# Patient Record
Sex: Female | Born: 1945 | Race: White | Hispanic: No | Marital: Single | Smoking: Never smoker
Health system: Southern US, Community
[De-identification: ages and names within clinical notes are randomized; demographics above are authoritative.]

## PROBLEM LIST (undated history)

## (undated) DIAGNOSIS — I6529 Occlusion and stenosis of unspecified carotid artery: Secondary | ICD-10-CM

## (undated) DIAGNOSIS — B379 Candidiasis, unspecified: Secondary | ICD-10-CM

## (undated) DIAGNOSIS — I350 Nonrheumatic aortic (valve) stenosis: Secondary | ICD-10-CM

## (undated) DIAGNOSIS — I10 Essential (primary) hypertension: Secondary | ICD-10-CM

## (undated) DIAGNOSIS — E119 Type 2 diabetes mellitus without complications: Secondary | ICD-10-CM

## (undated) DIAGNOSIS — L97919 Non-pressure chronic ulcer of unspecified part of right lower leg with unspecified severity: Secondary | ICD-10-CM

## (undated) DIAGNOSIS — E039 Hypothyroidism, unspecified: Secondary | ICD-10-CM

## (undated) DIAGNOSIS — E559 Vitamin D deficiency, unspecified: Secondary | ICD-10-CM

## (undated) DIAGNOSIS — E0590 Thyrotoxicosis, unspecified without thyrotoxic crisis or storm: Secondary | ICD-10-CM

## (undated) DIAGNOSIS — T148XXA Other injury of unspecified body region, initial encounter: Secondary | ICD-10-CM

## (undated) DIAGNOSIS — E785 Hyperlipidemia, unspecified: Secondary | ICD-10-CM

## (undated) DIAGNOSIS — H9193 Unspecified hearing loss, bilateral: Secondary | ICD-10-CM

## (undated) DIAGNOSIS — Z972 Presence of dental prosthetic device (complete) (partial): Secondary | ICD-10-CM

## (undated) DIAGNOSIS — R2681 Unsteadiness on feet: Secondary | ICD-10-CM

## (undated) DIAGNOSIS — I776 Arteritis, unspecified: Secondary | ICD-10-CM

## (undated) DIAGNOSIS — E1142 Type 2 diabetes mellitus with diabetic polyneuropathy: Secondary | ICD-10-CM

## (undated) DIAGNOSIS — L97929 Non-pressure chronic ulcer of unspecified part of left lower leg with unspecified severity: Secondary | ICD-10-CM

## (undated) DIAGNOSIS — I517 Cardiomegaly: Secondary | ICD-10-CM

## (undated) DIAGNOSIS — M791 Myalgia, unspecified site: Secondary | ICD-10-CM

## (undated) DIAGNOSIS — E049 Nontoxic goiter, unspecified: Secondary | ICD-10-CM

## (undated) DIAGNOSIS — L03115 Cellulitis of right lower limb: Secondary | ICD-10-CM

## (undated) DIAGNOSIS — G4762 Sleep related leg cramps: Secondary | ICD-10-CM

## (undated) DIAGNOSIS — I87331 Chronic venous hypertension (idiopathic) with ulcer and inflammation of right lower extremity: Secondary | ICD-10-CM

## (undated) DIAGNOSIS — E1140 Type 2 diabetes mellitus with diabetic neuropathy, unspecified: Secondary | ICD-10-CM

## (undated) DIAGNOSIS — R269 Unspecified abnormalities of gait and mobility: Secondary | ICD-10-CM

## (undated) HISTORY — DX: Unsteadiness on feet: R26.81

## (undated) HISTORY — DX: Chronic venous hypertension (idiopathic) with ulcer and inflammation of right lower extremity: I87.331

## (undated) HISTORY — DX: Candidiasis, unspecified: B37.9

## (undated) HISTORY — DX: Type 2 diabetes mellitus with diabetic polyneuropathy: E11.42

## (undated) HISTORY — DX: Thyrotoxicosis, unspecified without thyrotoxic crisis or storm: E05.90

## (undated) HISTORY — DX: Hyperlipidemia, unspecified: E78.5

## (undated) HISTORY — DX: Other injury of unspecified body region, initial encounter: T14.8XXA

## (undated) HISTORY — DX: Vitamin D deficiency, unspecified: E55.9

## (undated) HISTORY — DX: Arteritis, unspecified: I77.6

## (undated) HISTORY — DX: Sleep related leg cramps: G47.62

## (undated) HISTORY — PX: CATARACT EXTRACTION: SUR2

## (undated) HISTORY — DX: Essential (primary) hypertension: I10

## (undated) HISTORY — DX: Nontoxic goiter, unspecified: E04.9

## (undated) HISTORY — DX: Occlusion and stenosis of unspecified carotid artery: I65.29

## (undated) HISTORY — DX: Myalgia, unspecified site: M79.10

## (undated) HISTORY — DX: Unspecified abnormalities of gait and mobility: R26.9

## (undated) HISTORY — DX: Cellulitis of right lower limb: L03.115

## (undated) HISTORY — DX: Type 2 diabetes mellitus with diabetic neuropathy, unspecified: E11.40

## (undated) HISTORY — PX: HERNIA REPAIR: SHX51

## (undated) HISTORY — DX: Nonrheumatic aortic (valve) stenosis: I35.0

## (undated) HISTORY — DX: Cardiomegaly: I51.7

## (undated) HISTORY — DX: Non-pressure chronic ulcer of unspecified part of left lower leg with unspecified severity: L97.929

## (undated) HISTORY — DX: Non-pressure chronic ulcer of unspecified part of right lower leg with unspecified severity: L97.919

---

## 2011-09-14 DIAGNOSIS — L080 Pyoderma: Secondary | ICD-10-CM | POA: Diagnosis not present

## 2011-10-15 DIAGNOSIS — L080 Pyoderma: Secondary | ICD-10-CM | POA: Diagnosis not present

## 2011-12-25 DIAGNOSIS — E039 Hypothyroidism, unspecified: Secondary | ICD-10-CM | POA: Diagnosis not present

## 2011-12-25 DIAGNOSIS — E785 Hyperlipidemia, unspecified: Secondary | ICD-10-CM | POA: Diagnosis not present

## 2011-12-25 DIAGNOSIS — E0500 Thyrotoxicosis with diffuse goiter without thyrotoxic crisis or storm: Secondary | ICD-10-CM | POA: Diagnosis not present

## 2012-03-25 DIAGNOSIS — E039 Hypothyroidism, unspecified: Secondary | ICD-10-CM | POA: Diagnosis not present

## 2012-03-25 DIAGNOSIS — I10 Essential (primary) hypertension: Secondary | ICD-10-CM | POA: Diagnosis not present

## 2012-03-25 DIAGNOSIS — E785 Hyperlipidemia, unspecified: Secondary | ICD-10-CM | POA: Diagnosis not present

## 2012-06-25 DIAGNOSIS — E785 Hyperlipidemia, unspecified: Secondary | ICD-10-CM | POA: Diagnosis not present

## 2012-06-25 DIAGNOSIS — E039 Hypothyroidism, unspecified: Secondary | ICD-10-CM | POA: Diagnosis not present

## 2012-06-25 DIAGNOSIS — I10 Essential (primary) hypertension: Secondary | ICD-10-CM | POA: Diagnosis not present

## 2012-10-08 DIAGNOSIS — E039 Hypothyroidism, unspecified: Secondary | ICD-10-CM | POA: Diagnosis not present

## 2012-10-08 DIAGNOSIS — R269 Unspecified abnormalities of gait and mobility: Secondary | ICD-10-CM | POA: Diagnosis not present

## 2012-10-08 DIAGNOSIS — E0500 Thyrotoxicosis with diffuse goiter without thyrotoxic crisis or storm: Secondary | ICD-10-CM | POA: Diagnosis not present

## 2013-02-09 DIAGNOSIS — E0500 Thyrotoxicosis with diffuse goiter without thyrotoxic crisis or storm: Secondary | ICD-10-CM | POA: Diagnosis not present

## 2013-02-09 DIAGNOSIS — E1169 Type 2 diabetes mellitus with other specified complication: Secondary | ICD-10-CM | POA: Diagnosis not present

## 2013-02-09 DIAGNOSIS — R269 Unspecified abnormalities of gait and mobility: Secondary | ICD-10-CM | POA: Diagnosis not present

## 2013-02-09 DIAGNOSIS — I10 Essential (primary) hypertension: Secondary | ICD-10-CM | POA: Diagnosis not present

## 2013-02-09 DIAGNOSIS — E785 Hyperlipidemia, unspecified: Secondary | ICD-10-CM | POA: Diagnosis not present

## 2013-02-09 DIAGNOSIS — Z6827 Body mass index (BMI) 27.0-27.9, adult: Secondary | ICD-10-CM | POA: Diagnosis not present

## 2013-06-30 DIAGNOSIS — E785 Hyperlipidemia, unspecified: Secondary | ICD-10-CM | POA: Diagnosis not present

## 2013-06-30 DIAGNOSIS — I10 Essential (primary) hypertension: Secondary | ICD-10-CM | POA: Diagnosis not present

## 2013-06-30 DIAGNOSIS — E039 Hypothyroidism, unspecified: Secondary | ICD-10-CM | POA: Diagnosis not present

## 2013-06-30 DIAGNOSIS — Z1331 Encounter for screening for depression: Secondary | ICD-10-CM | POA: Diagnosis not present

## 2013-06-30 DIAGNOSIS — Z6826 Body mass index (BMI) 26.0-26.9, adult: Secondary | ICD-10-CM | POA: Diagnosis not present

## 2013-11-03 DIAGNOSIS — Z6826 Body mass index (BMI) 26.0-26.9, adult: Secondary | ICD-10-CM | POA: Diagnosis not present

## 2013-11-03 DIAGNOSIS — IMO0002 Reserved for concepts with insufficient information to code with codable children: Secondary | ICD-10-CM | POA: Diagnosis not present

## 2013-11-03 DIAGNOSIS — I10 Essential (primary) hypertension: Secondary | ICD-10-CM | POA: Diagnosis not present

## 2013-11-03 DIAGNOSIS — E1165 Type 2 diabetes mellitus with hyperglycemia: Secondary | ICD-10-CM | POA: Diagnosis not present

## 2013-11-03 DIAGNOSIS — E039 Hypothyroidism, unspecified: Secondary | ICD-10-CM | POA: Diagnosis not present

## 2013-11-03 DIAGNOSIS — E785 Hyperlipidemia, unspecified: Secondary | ICD-10-CM | POA: Diagnosis not present

## 2013-11-20 DIAGNOSIS — H2510 Age-related nuclear cataract, unspecified eye: Secondary | ICD-10-CM | POA: Diagnosis not present

## 2013-11-20 DIAGNOSIS — E119 Type 2 diabetes mellitus without complications: Secondary | ICD-10-CM | POA: Diagnosis not present

## 2013-12-28 DIAGNOSIS — H2510 Age-related nuclear cataract, unspecified eye: Secondary | ICD-10-CM | POA: Diagnosis not present

## 2013-12-28 DIAGNOSIS — H35349 Macular cyst, hole, or pseudohole, unspecified eye: Secondary | ICD-10-CM | POA: Diagnosis not present

## 2013-12-28 DIAGNOSIS — E119 Type 2 diabetes mellitus without complications: Secondary | ICD-10-CM | POA: Diagnosis not present

## 2014-01-06 DIAGNOSIS — H2510 Age-related nuclear cataract, unspecified eye: Secondary | ICD-10-CM | POA: Diagnosis not present

## 2014-01-13 DIAGNOSIS — H269 Unspecified cataract: Secondary | ICD-10-CM | POA: Diagnosis not present

## 2014-01-13 DIAGNOSIS — H2589 Other age-related cataract: Secondary | ICD-10-CM | POA: Diagnosis not present

## 2014-01-13 DIAGNOSIS — H2510 Age-related nuclear cataract, unspecified eye: Secondary | ICD-10-CM | POA: Diagnosis not present

## 2014-02-08 DIAGNOSIS — Z6828 Body mass index (BMI) 28.0-28.9, adult: Secondary | ICD-10-CM | POA: Diagnosis not present

## 2014-02-08 DIAGNOSIS — IMO0001 Reserved for inherently not codable concepts without codable children: Secondary | ICD-10-CM | POA: Diagnosis not present

## 2014-02-08 DIAGNOSIS — I10 Essential (primary) hypertension: Secondary | ICD-10-CM | POA: Diagnosis not present

## 2014-02-08 DIAGNOSIS — E039 Hypothyroidism, unspecified: Secondary | ICD-10-CM | POA: Diagnosis not present

## 2014-02-08 DIAGNOSIS — I776 Arteritis, unspecified: Secondary | ICD-10-CM | POA: Diagnosis not present

## 2014-02-17 DIAGNOSIS — H2510 Age-related nuclear cataract, unspecified eye: Secondary | ICD-10-CM | POA: Diagnosis not present

## 2014-03-03 DIAGNOSIS — H269 Unspecified cataract: Secondary | ICD-10-CM | POA: Diagnosis not present

## 2014-03-03 DIAGNOSIS — H2510 Age-related nuclear cataract, unspecified eye: Secondary | ICD-10-CM | POA: Diagnosis not present

## 2014-03-03 DIAGNOSIS — H2589 Other age-related cataract: Secondary | ICD-10-CM | POA: Diagnosis not present

## 2014-03-11 DIAGNOSIS — L255 Unspecified contact dermatitis due to plants, except food: Secondary | ICD-10-CM | POA: Diagnosis not present

## 2014-03-11 DIAGNOSIS — Z6828 Body mass index (BMI) 28.0-28.9, adult: Secondary | ICD-10-CM | POA: Diagnosis not present

## 2014-03-11 DIAGNOSIS — IMO0001 Reserved for inherently not codable concepts without codable children: Secondary | ICD-10-CM | POA: Diagnosis not present

## 2014-03-11 DIAGNOSIS — I10 Essential (primary) hypertension: Secondary | ICD-10-CM | POA: Diagnosis not present

## 2014-05-11 DIAGNOSIS — R269 Unspecified abnormalities of gait and mobility: Secondary | ICD-10-CM | POA: Diagnosis not present

## 2014-05-11 DIAGNOSIS — I10 Essential (primary) hypertension: Secondary | ICD-10-CM | POA: Diagnosis not present

## 2014-05-11 DIAGNOSIS — IMO0001 Reserved for inherently not codable concepts without codable children: Secondary | ICD-10-CM | POA: Diagnosis not present

## 2014-05-11 DIAGNOSIS — E0500 Thyrotoxicosis with diffuse goiter without thyrotoxic crisis or storm: Secondary | ICD-10-CM | POA: Diagnosis not present

## 2014-05-11 DIAGNOSIS — Z6827 Body mass index (BMI) 27.0-27.9, adult: Secondary | ICD-10-CM | POA: Diagnosis not present

## 2014-05-11 DIAGNOSIS — E039 Hypothyroidism, unspecified: Secondary | ICD-10-CM | POA: Diagnosis not present

## 2014-05-11 DIAGNOSIS — E785 Hyperlipidemia, unspecified: Secondary | ICD-10-CM | POA: Diagnosis not present

## 2014-06-22 DIAGNOSIS — E1165 Type 2 diabetes mellitus with hyperglycemia: Secondary | ICD-10-CM | POA: Diagnosis not present

## 2014-06-22 DIAGNOSIS — Z6828 Body mass index (BMI) 28.0-28.9, adult: Secondary | ICD-10-CM | POA: Diagnosis not present

## 2014-06-22 DIAGNOSIS — I10 Essential (primary) hypertension: Secondary | ICD-10-CM | POA: Diagnosis not present

## 2014-07-30 DIAGNOSIS — R269 Unspecified abnormalities of gait and mobility: Secondary | ICD-10-CM | POA: Diagnosis not present

## 2014-07-30 DIAGNOSIS — E1165 Type 2 diabetes mellitus with hyperglycemia: Secondary | ICD-10-CM | POA: Diagnosis not present

## 2014-07-30 DIAGNOSIS — I10 Essential (primary) hypertension: Secondary | ICD-10-CM | POA: Diagnosis not present

## 2014-07-30 DIAGNOSIS — Z1389 Encounter for screening for other disorder: Secondary | ICD-10-CM | POA: Diagnosis not present

## 2014-07-30 DIAGNOSIS — E039 Hypothyroidism, unspecified: Secondary | ICD-10-CM | POA: Diagnosis not present

## 2014-07-30 DIAGNOSIS — Z6829 Body mass index (BMI) 29.0-29.9, adult: Secondary | ICD-10-CM | POA: Diagnosis not present

## 2014-07-30 DIAGNOSIS — E785 Hyperlipidemia, unspecified: Secondary | ICD-10-CM | POA: Diagnosis not present

## 2014-08-19 DIAGNOSIS — E1165 Type 2 diabetes mellitus with hyperglycemia: Secondary | ICD-10-CM | POA: Diagnosis not present

## 2014-08-19 DIAGNOSIS — M5416 Radiculopathy, lumbar region: Secondary | ICD-10-CM | POA: Diagnosis not present

## 2014-08-19 DIAGNOSIS — L255 Unspecified contact dermatitis due to plants, except food: Secondary | ICD-10-CM | POA: Diagnosis not present

## 2014-08-19 DIAGNOSIS — Z6829 Body mass index (BMI) 29.0-29.9, adult: Secondary | ICD-10-CM | POA: Diagnosis not present

## 2014-08-23 DIAGNOSIS — L249 Irritant contact dermatitis, unspecified cause: Secondary | ICD-10-CM | POA: Diagnosis not present

## 2014-09-14 DIAGNOSIS — Z6827 Body mass index (BMI) 27.0-27.9, adult: Secondary | ICD-10-CM | POA: Diagnosis not present

## 2014-09-14 DIAGNOSIS — E1165 Type 2 diabetes mellitus with hyperglycemia: Secondary | ICD-10-CM | POA: Diagnosis not present

## 2014-09-14 DIAGNOSIS — I10 Essential (primary) hypertension: Secondary | ICD-10-CM | POA: Diagnosis not present

## 2014-10-21 DIAGNOSIS — L301 Dyshidrosis [pompholyx]: Secondary | ICD-10-CM | POA: Diagnosis not present

## 2014-11-30 DIAGNOSIS — E039 Hypothyroidism, unspecified: Secondary | ICD-10-CM | POA: Diagnosis not present

## 2014-11-30 DIAGNOSIS — E785 Hyperlipidemia, unspecified: Secondary | ICD-10-CM | POA: Diagnosis not present

## 2014-11-30 DIAGNOSIS — I10 Essential (primary) hypertension: Secondary | ICD-10-CM | POA: Diagnosis not present

## 2014-11-30 DIAGNOSIS — Z6828 Body mass index (BMI) 28.0-28.9, adult: Secondary | ICD-10-CM | POA: Diagnosis not present

## 2014-11-30 DIAGNOSIS — E1165 Type 2 diabetes mellitus with hyperglycemia: Secondary | ICD-10-CM | POA: Diagnosis not present

## 2015-04-04 DIAGNOSIS — M5416 Radiculopathy, lumbar region: Secondary | ICD-10-CM | POA: Diagnosis not present

## 2015-04-04 DIAGNOSIS — I10 Essential (primary) hypertension: Secondary | ICD-10-CM | POA: Diagnosis not present

## 2015-04-04 DIAGNOSIS — Z6827 Body mass index (BMI) 27.0-27.9, adult: Secondary | ICD-10-CM | POA: Diagnosis not present

## 2015-04-04 DIAGNOSIS — E1165 Type 2 diabetes mellitus with hyperglycemia: Secondary | ICD-10-CM | POA: Diagnosis not present

## 2015-04-04 DIAGNOSIS — R269 Unspecified abnormalities of gait and mobility: Secondary | ICD-10-CM | POA: Diagnosis not present

## 2015-04-04 DIAGNOSIS — E785 Hyperlipidemia, unspecified: Secondary | ICD-10-CM | POA: Diagnosis not present

## 2015-04-04 DIAGNOSIS — E0500 Thyrotoxicosis with diffuse goiter without thyrotoxic crisis or storm: Secondary | ICD-10-CM | POA: Diagnosis not present

## 2015-04-04 DIAGNOSIS — E039 Hypothyroidism, unspecified: Secondary | ICD-10-CM | POA: Diagnosis not present

## 2015-04-04 DIAGNOSIS — Z1389 Encounter for screening for other disorder: Secondary | ICD-10-CM | POA: Diagnosis not present

## 2015-05-31 DIAGNOSIS — Z23 Encounter for immunization: Secondary | ICD-10-CM | POA: Diagnosis not present

## 2015-05-31 DIAGNOSIS — E1165 Type 2 diabetes mellitus with hyperglycemia: Secondary | ICD-10-CM | POA: Diagnosis not present

## 2015-05-31 DIAGNOSIS — I87319 Chronic venous hypertension (idiopathic) with ulcer of unspecified lower extremity: Secondary | ICD-10-CM | POA: Diagnosis not present

## 2015-05-31 DIAGNOSIS — S8010XA Contusion of unspecified lower leg, initial encounter: Secondary | ICD-10-CM | POA: Diagnosis not present

## 2015-05-31 DIAGNOSIS — L089 Local infection of the skin and subcutaneous tissue, unspecified: Secondary | ICD-10-CM | POA: Diagnosis not present

## 2015-06-03 DIAGNOSIS — Z6827 Body mass index (BMI) 27.0-27.9, adult: Secondary | ICD-10-CM | POA: Diagnosis not present

## 2015-06-03 DIAGNOSIS — I87319 Chronic venous hypertension (idiopathic) with ulcer of unspecified lower extremity: Secondary | ICD-10-CM | POA: Diagnosis not present

## 2015-06-08 DIAGNOSIS — I87319 Chronic venous hypertension (idiopathic) with ulcer of unspecified lower extremity: Secondary | ICD-10-CM | POA: Diagnosis not present

## 2015-06-08 DIAGNOSIS — L089 Local infection of the skin and subcutaneous tissue, unspecified: Secondary | ICD-10-CM | POA: Diagnosis not present

## 2015-06-08 DIAGNOSIS — Z6826 Body mass index (BMI) 26.0-26.9, adult: Secondary | ICD-10-CM | POA: Diagnosis not present

## 2015-06-10 ENCOUNTER — Encounter (HOSPITAL_BASED_OUTPATIENT_CLINIC_OR_DEPARTMENT_OTHER): Payer: Self-pay

## 2015-06-15 DIAGNOSIS — Z6827 Body mass index (BMI) 27.0-27.9, adult: Secondary | ICD-10-CM | POA: Diagnosis not present

## 2015-06-15 DIAGNOSIS — E1165 Type 2 diabetes mellitus with hyperglycemia: Secondary | ICD-10-CM | POA: Diagnosis not present

## 2015-06-15 DIAGNOSIS — I87319 Chronic venous hypertension (idiopathic) with ulcer of unspecified lower extremity: Secondary | ICD-10-CM | POA: Diagnosis not present

## 2015-06-20 DIAGNOSIS — Z6826 Body mass index (BMI) 26.0-26.9, adult: Secondary | ICD-10-CM | POA: Diagnosis not present

## 2015-06-20 DIAGNOSIS — E1165 Type 2 diabetes mellitus with hyperglycemia: Secondary | ICD-10-CM | POA: Diagnosis not present

## 2015-06-20 DIAGNOSIS — I87319 Chronic venous hypertension (idiopathic) with ulcer of unspecified lower extremity: Secondary | ICD-10-CM | POA: Diagnosis not present

## 2015-06-24 DIAGNOSIS — I87319 Chronic venous hypertension (idiopathic) with ulcer of unspecified lower extremity: Secondary | ICD-10-CM | POA: Diagnosis not present

## 2015-06-24 DIAGNOSIS — Z6826 Body mass index (BMI) 26.0-26.9, adult: Secondary | ICD-10-CM | POA: Diagnosis not present

## 2015-07-06 ENCOUNTER — Encounter (HOSPITAL_BASED_OUTPATIENT_CLINIC_OR_DEPARTMENT_OTHER): Payer: Medicare Other | Attending: Surgery

## 2015-07-06 DIAGNOSIS — I776 Arteritis, unspecified: Secondary | ICD-10-CM | POA: Insufficient documentation

## 2015-07-06 DIAGNOSIS — Z79899 Other long term (current) drug therapy: Secondary | ICD-10-CM | POA: Insufficient documentation

## 2015-07-06 DIAGNOSIS — L97221 Non-pressure chronic ulcer of left calf limited to breakdown of skin: Secondary | ICD-10-CM | POA: Diagnosis not present

## 2015-07-06 DIAGNOSIS — S81812A Laceration without foreign body, left lower leg, initial encounter: Secondary | ICD-10-CM | POA: Diagnosis not present

## 2015-07-06 DIAGNOSIS — E11622 Type 2 diabetes mellitus with other skin ulcer: Secondary | ICD-10-CM | POA: Diagnosis present

## 2015-07-06 DIAGNOSIS — Z792 Long term (current) use of antibiotics: Secondary | ICD-10-CM | POA: Diagnosis not present

## 2015-07-06 DIAGNOSIS — I83222 Varicose veins of left lower extremity with both ulcer of calf and inflammation: Secondary | ICD-10-CM | POA: Diagnosis not present

## 2015-07-06 DIAGNOSIS — E1136 Type 2 diabetes mellitus with diabetic cataract: Secondary | ICD-10-CM | POA: Insufficient documentation

## 2015-07-06 DIAGNOSIS — L97821 Non-pressure chronic ulcer of other part of left lower leg limited to breakdown of skin: Secondary | ICD-10-CM | POA: Insufficient documentation

## 2015-07-06 DIAGNOSIS — Z794 Long term (current) use of insulin: Secondary | ICD-10-CM | POA: Diagnosis not present

## 2015-07-18 ENCOUNTER — Ambulatory Visit (HOSPITAL_COMMUNITY)
Admission: RE | Admit: 2015-07-18 | Discharge: 2015-07-18 | Disposition: A | Discharge status: Home / Self Care | Payer: Medicare Other | Source: Ambulatory Visit | Attending: Surgery | Admitting: Surgery

## 2015-07-18 ENCOUNTER — Other Ambulatory Visit: Payer: Self-pay | Admitting: Surgery

## 2015-07-18 ENCOUNTER — Ambulatory Visit (INDEPENDENT_AMBULATORY_CARE_PROVIDER_SITE_OTHER)
Admission: RE | Admit: 2015-07-18 | Discharge: 2015-07-18 | Disposition: A | Discharge status: Home / Self Care | Payer: Medicare Other | Source: Ambulatory Visit | Attending: Surgery | Admitting: Surgery

## 2015-07-18 DIAGNOSIS — L97929 Non-pressure chronic ulcer of unspecified part of left lower leg with unspecified severity: Secondary | ICD-10-CM

## 2015-07-20 DIAGNOSIS — L97821 Non-pressure chronic ulcer of other part of left lower leg limited to breakdown of skin: Secondary | ICD-10-CM | POA: Diagnosis not present

## 2015-07-20 DIAGNOSIS — Z79899 Other long term (current) drug therapy: Secondary | ICD-10-CM | POA: Diagnosis not present

## 2015-07-20 DIAGNOSIS — I83222 Varicose veins of left lower extremity with both ulcer of calf and inflammation: Secondary | ICD-10-CM | POA: Diagnosis not present

## 2015-07-20 DIAGNOSIS — E11622 Type 2 diabetes mellitus with other skin ulcer: Secondary | ICD-10-CM | POA: Diagnosis not present

## 2015-07-20 DIAGNOSIS — Z792 Long term (current) use of antibiotics: Secondary | ICD-10-CM | POA: Diagnosis not present

## 2015-07-20 DIAGNOSIS — S81812A Laceration without foreign body, left lower leg, initial encounter: Secondary | ICD-10-CM | POA: Diagnosis not present

## 2015-07-20 DIAGNOSIS — L97221 Non-pressure chronic ulcer of left calf limited to breakdown of skin: Secondary | ICD-10-CM | POA: Diagnosis not present

## 2015-07-20 DIAGNOSIS — Z794 Long term (current) use of insulin: Secondary | ICD-10-CM | POA: Diagnosis not present

## 2015-07-20 DIAGNOSIS — E1136 Type 2 diabetes mellitus with diabetic cataract: Secondary | ICD-10-CM | POA: Diagnosis not present

## 2015-08-19 DIAGNOSIS — Z6826 Body mass index (BMI) 26.0-26.9, adult: Secondary | ICD-10-CM | POA: Diagnosis not present

## 2015-08-19 DIAGNOSIS — E038 Other specified hypothyroidism: Secondary | ICD-10-CM | POA: Diagnosis not present

## 2015-08-19 DIAGNOSIS — I10 Essential (primary) hypertension: Secondary | ICD-10-CM | POA: Diagnosis not present

## 2015-08-19 DIAGNOSIS — I87319 Chronic venous hypertension (idiopathic) with ulcer of unspecified lower extremity: Secondary | ICD-10-CM | POA: Diagnosis not present

## 2015-08-19 DIAGNOSIS — E1165 Type 2 diabetes mellitus with hyperglycemia: Secondary | ICD-10-CM | POA: Diagnosis not present

## 2015-08-19 DIAGNOSIS — E784 Other hyperlipidemia: Secondary | ICD-10-CM | POA: Diagnosis not present

## 2015-08-19 DIAGNOSIS — R2689 Other abnormalities of gait and mobility: Secondary | ICD-10-CM | POA: Diagnosis not present

## 2015-09-01 DIAGNOSIS — I8312 Varicose veins of left lower extremity with inflammation: Secondary | ICD-10-CM | POA: Diagnosis not present

## 2015-09-01 DIAGNOSIS — I872 Venous insufficiency (chronic) (peripheral): Secondary | ICD-10-CM | POA: Diagnosis not present

## 2015-09-01 DIAGNOSIS — I8311 Varicose veins of right lower extremity with inflammation: Secondary | ICD-10-CM | POA: Diagnosis not present

## 2015-09-12 ENCOUNTER — Encounter (HOSPITAL_BASED_OUTPATIENT_CLINIC_OR_DEPARTMENT_OTHER): Payer: Medicare Other | Attending: Internal Medicine

## 2015-09-12 DIAGNOSIS — I8310 Varicose veins of unspecified lower extremity with inflammation: Secondary | ICD-10-CM | POA: Diagnosis not present

## 2015-09-12 DIAGNOSIS — E1159 Type 2 diabetes mellitus with other circulatory complications: Secondary | ICD-10-CM | POA: Diagnosis not present

## 2015-09-12 DIAGNOSIS — I87319 Chronic venous hypertension (idiopathic) with ulcer of unspecified lower extremity: Secondary | ICD-10-CM | POA: Diagnosis not present

## 2015-09-12 DIAGNOSIS — E1165 Type 2 diabetes mellitus with hyperglycemia: Secondary | ICD-10-CM | POA: Diagnosis not present

## 2015-09-12 DIAGNOSIS — Z6825 Body mass index (BMI) 25.0-25.9, adult: Secondary | ICD-10-CM | POA: Diagnosis not present

## 2015-09-15 DIAGNOSIS — I87319 Chronic venous hypertension (idiopathic) with ulcer of unspecified lower extremity: Secondary | ICD-10-CM | POA: Diagnosis not present

## 2015-09-15 DIAGNOSIS — E1165 Type 2 diabetes mellitus with hyperglycemia: Secondary | ICD-10-CM | POA: Diagnosis not present

## 2015-09-15 DIAGNOSIS — E1159 Type 2 diabetes mellitus with other circulatory complications: Secondary | ICD-10-CM | POA: Diagnosis not present

## 2015-09-15 DIAGNOSIS — Z6825 Body mass index (BMI) 25.0-25.9, adult: Secondary | ICD-10-CM | POA: Diagnosis not present

## 2015-09-15 DIAGNOSIS — I8310 Varicose veins of unspecified lower extremity with inflammation: Secondary | ICD-10-CM | POA: Diagnosis not present

## 2015-09-19 DIAGNOSIS — G4762 Sleep related leg cramps: Secondary | ICD-10-CM | POA: Diagnosis not present

## 2015-09-19 DIAGNOSIS — E1159 Type 2 diabetes mellitus with other circulatory complications: Secondary | ICD-10-CM | POA: Diagnosis not present

## 2015-09-19 DIAGNOSIS — Z6824 Body mass index (BMI) 24.0-24.9, adult: Secondary | ICD-10-CM | POA: Diagnosis not present

## 2015-09-19 DIAGNOSIS — I87319 Chronic venous hypertension (idiopathic) with ulcer of unspecified lower extremity: Secondary | ICD-10-CM | POA: Diagnosis not present

## 2015-09-19 DIAGNOSIS — I8310 Varicose veins of unspecified lower extremity with inflammation: Secondary | ICD-10-CM | POA: Diagnosis not present

## 2015-09-22 DIAGNOSIS — E1159 Type 2 diabetes mellitus with other circulatory complications: Secondary | ICD-10-CM | POA: Diagnosis not present

## 2015-09-22 DIAGNOSIS — I10 Essential (primary) hypertension: Secondary | ICD-10-CM | POA: Diagnosis not present

## 2015-09-22 DIAGNOSIS — Z6825 Body mass index (BMI) 25.0-25.9, adult: Secondary | ICD-10-CM | POA: Diagnosis not present

## 2015-09-22 DIAGNOSIS — I87319 Chronic venous hypertension (idiopathic) with ulcer of unspecified lower extremity: Secondary | ICD-10-CM | POA: Diagnosis not present

## 2015-09-26 DIAGNOSIS — I87319 Chronic venous hypertension (idiopathic) with ulcer of unspecified lower extremity: Secondary | ICD-10-CM | POA: Diagnosis not present

## 2015-09-26 DIAGNOSIS — I8310 Varicose veins of unspecified lower extremity with inflammation: Secondary | ICD-10-CM | POA: Diagnosis not present

## 2015-09-26 DIAGNOSIS — Z6825 Body mass index (BMI) 25.0-25.9, adult: Secondary | ICD-10-CM | POA: Diagnosis not present

## 2015-09-26 DIAGNOSIS — E1159 Type 2 diabetes mellitus with other circulatory complications: Secondary | ICD-10-CM | POA: Diagnosis not present

## 2015-09-29 DIAGNOSIS — E1159 Type 2 diabetes mellitus with other circulatory complications: Secondary | ICD-10-CM | POA: Diagnosis not present

## 2015-09-29 DIAGNOSIS — I87319 Chronic venous hypertension (idiopathic) with ulcer of unspecified lower extremity: Secondary | ICD-10-CM | POA: Diagnosis not present

## 2015-09-29 DIAGNOSIS — Z6825 Body mass index (BMI) 25.0-25.9, adult: Secondary | ICD-10-CM | POA: Diagnosis not present

## 2015-10-03 DIAGNOSIS — Z6825 Body mass index (BMI) 25.0-25.9, adult: Secondary | ICD-10-CM | POA: Diagnosis not present

## 2015-10-03 DIAGNOSIS — I87319 Chronic venous hypertension (idiopathic) with ulcer of unspecified lower extremity: Secondary | ICD-10-CM | POA: Diagnosis not present

## 2015-10-06 DIAGNOSIS — G4762 Sleep related leg cramps: Secondary | ICD-10-CM | POA: Diagnosis not present

## 2015-10-06 DIAGNOSIS — I739 Peripheral vascular disease, unspecified: Secondary | ICD-10-CM | POA: Diagnosis not present

## 2015-10-06 DIAGNOSIS — E1159 Type 2 diabetes mellitus with other circulatory complications: Secondary | ICD-10-CM | POA: Diagnosis not present

## 2015-10-06 DIAGNOSIS — I10 Essential (primary) hypertension: Secondary | ICD-10-CM | POA: Diagnosis not present

## 2015-10-06 DIAGNOSIS — I87319 Chronic venous hypertension (idiopathic) with ulcer of unspecified lower extremity: Secondary | ICD-10-CM | POA: Diagnosis not present

## 2015-10-06 DIAGNOSIS — Z6825 Body mass index (BMI) 25.0-25.9, adult: Secondary | ICD-10-CM | POA: Diagnosis not present

## 2015-10-10 DIAGNOSIS — Z6826 Body mass index (BMI) 26.0-26.9, adult: Secondary | ICD-10-CM | POA: Diagnosis not present

## 2015-10-10 DIAGNOSIS — E1159 Type 2 diabetes mellitus with other circulatory complications: Secondary | ICD-10-CM | POA: Diagnosis not present

## 2015-10-10 DIAGNOSIS — I87319 Chronic venous hypertension (idiopathic) with ulcer of unspecified lower extremity: Secondary | ICD-10-CM | POA: Diagnosis not present

## 2015-10-10 DIAGNOSIS — I10 Essential (primary) hypertension: Secondary | ICD-10-CM | POA: Diagnosis not present

## 2015-10-13 DIAGNOSIS — I87319 Chronic venous hypertension (idiopathic) with ulcer of unspecified lower extremity: Secondary | ICD-10-CM | POA: Diagnosis not present

## 2015-10-13 DIAGNOSIS — Z6826 Body mass index (BMI) 26.0-26.9, adult: Secondary | ICD-10-CM | POA: Diagnosis not present

## 2015-10-13 DIAGNOSIS — E1159 Type 2 diabetes mellitus with other circulatory complications: Secondary | ICD-10-CM | POA: Diagnosis not present

## 2015-10-13 DIAGNOSIS — I739 Peripheral vascular disease, unspecified: Secondary | ICD-10-CM | POA: Diagnosis not present

## 2015-10-17 DIAGNOSIS — I739 Peripheral vascular disease, unspecified: Secondary | ICD-10-CM | POA: Diagnosis not present

## 2015-10-17 DIAGNOSIS — E039 Hypothyroidism, unspecified: Secondary | ICD-10-CM | POA: Diagnosis not present

## 2015-10-17 DIAGNOSIS — E1159 Type 2 diabetes mellitus with other circulatory complications: Secondary | ICD-10-CM | POA: Diagnosis not present

## 2015-10-17 DIAGNOSIS — L089 Local infection of the skin and subcutaneous tissue, unspecified: Secondary | ICD-10-CM | POA: Diagnosis not present

## 2015-10-17 DIAGNOSIS — Z6826 Body mass index (BMI) 26.0-26.9, adult: Secondary | ICD-10-CM | POA: Diagnosis not present

## 2015-10-17 DIAGNOSIS — I87319 Chronic venous hypertension (idiopathic) with ulcer of unspecified lower extremity: Secondary | ICD-10-CM | POA: Diagnosis not present

## 2015-10-20 DIAGNOSIS — E1159 Type 2 diabetes mellitus with other circulatory complications: Secondary | ICD-10-CM | POA: Diagnosis not present

## 2015-10-20 DIAGNOSIS — Z6826 Body mass index (BMI) 26.0-26.9, adult: Secondary | ICD-10-CM | POA: Diagnosis not present

## 2015-10-20 DIAGNOSIS — I87319 Chronic venous hypertension (idiopathic) with ulcer of unspecified lower extremity: Secondary | ICD-10-CM | POA: Diagnosis not present

## 2015-10-24 DIAGNOSIS — E1159 Type 2 diabetes mellitus with other circulatory complications: Secondary | ICD-10-CM | POA: Diagnosis not present

## 2015-10-24 DIAGNOSIS — I739 Peripheral vascular disease, unspecified: Secondary | ICD-10-CM | POA: Diagnosis not present

## 2015-10-24 DIAGNOSIS — Z6825 Body mass index (BMI) 25.0-25.9, adult: Secondary | ICD-10-CM | POA: Diagnosis not present

## 2015-10-24 DIAGNOSIS — I87319 Chronic venous hypertension (idiopathic) with ulcer of unspecified lower extremity: Secondary | ICD-10-CM | POA: Diagnosis not present

## 2015-10-27 DIAGNOSIS — Z6825 Body mass index (BMI) 25.0-25.9, adult: Secondary | ICD-10-CM | POA: Diagnosis not present

## 2015-10-27 DIAGNOSIS — I87319 Chronic venous hypertension (idiopathic) with ulcer of unspecified lower extremity: Secondary | ICD-10-CM | POA: Diagnosis not present

## 2015-10-27 DIAGNOSIS — I739 Peripheral vascular disease, unspecified: Secondary | ICD-10-CM | POA: Diagnosis not present

## 2015-10-31 DIAGNOSIS — E1159 Type 2 diabetes mellitus with other circulatory complications: Secondary | ICD-10-CM | POA: Diagnosis not present

## 2015-10-31 DIAGNOSIS — I87319 Chronic venous hypertension (idiopathic) with ulcer of unspecified lower extremity: Secondary | ICD-10-CM | POA: Diagnosis not present

## 2015-10-31 DIAGNOSIS — Z6826 Body mass index (BMI) 26.0-26.9, adult: Secondary | ICD-10-CM | POA: Diagnosis not present

## 2015-10-31 DIAGNOSIS — I739 Peripheral vascular disease, unspecified: Secondary | ICD-10-CM | POA: Diagnosis not present

## 2015-11-03 DIAGNOSIS — I739 Peripheral vascular disease, unspecified: Secondary | ICD-10-CM | POA: Diagnosis not present

## 2015-11-03 DIAGNOSIS — Z6826 Body mass index (BMI) 26.0-26.9, adult: Secondary | ICD-10-CM | POA: Diagnosis not present

## 2015-11-03 DIAGNOSIS — I87319 Chronic venous hypertension (idiopathic) with ulcer of unspecified lower extremity: Secondary | ICD-10-CM | POA: Diagnosis not present

## 2015-11-08 DIAGNOSIS — Z5189 Encounter for other specified aftercare: Secondary | ICD-10-CM | POA: Diagnosis not present

## 2015-11-08 DIAGNOSIS — I87319 Chronic venous hypertension (idiopathic) with ulcer of unspecified lower extremity: Secondary | ICD-10-CM | POA: Diagnosis not present

## 2015-11-11 DIAGNOSIS — I739 Peripheral vascular disease, unspecified: Secondary | ICD-10-CM | POA: Diagnosis not present

## 2015-11-11 DIAGNOSIS — I10 Essential (primary) hypertension: Secondary | ICD-10-CM | POA: Diagnosis not present

## 2015-11-11 DIAGNOSIS — I87319 Chronic venous hypertension (idiopathic) with ulcer of unspecified lower extremity: Secondary | ICD-10-CM | POA: Diagnosis not present

## 2015-11-11 DIAGNOSIS — Z6828 Body mass index (BMI) 28.0-28.9, adult: Secondary | ICD-10-CM | POA: Diagnosis not present

## 2015-11-11 DIAGNOSIS — E1159 Type 2 diabetes mellitus with other circulatory complications: Secondary | ICD-10-CM | POA: Diagnosis not present

## 2015-11-14 DIAGNOSIS — Z5189 Encounter for other specified aftercare: Secondary | ICD-10-CM | POA: Diagnosis not present

## 2015-11-14 DIAGNOSIS — I87319 Chronic venous hypertension (idiopathic) with ulcer of unspecified lower extremity: Secondary | ICD-10-CM | POA: Diagnosis not present

## 2015-11-14 DIAGNOSIS — Z6826 Body mass index (BMI) 26.0-26.9, adult: Secondary | ICD-10-CM | POA: Diagnosis not present

## 2015-11-17 DIAGNOSIS — E1159 Type 2 diabetes mellitus with other circulatory complications: Secondary | ICD-10-CM | POA: Diagnosis not present

## 2015-11-17 DIAGNOSIS — I10 Essential (primary) hypertension: Secondary | ICD-10-CM | POA: Diagnosis not present

## 2015-11-17 DIAGNOSIS — I739 Peripheral vascular disease, unspecified: Secondary | ICD-10-CM | POA: Diagnosis not present

## 2015-11-17 DIAGNOSIS — I87319 Chronic venous hypertension (idiopathic) with ulcer of unspecified lower extremity: Secondary | ICD-10-CM | POA: Diagnosis not present

## 2015-11-17 DIAGNOSIS — Z6826 Body mass index (BMI) 26.0-26.9, adult: Secondary | ICD-10-CM | POA: Diagnosis not present

## 2015-11-21 DIAGNOSIS — I87319 Chronic venous hypertension (idiopathic) with ulcer of unspecified lower extremity: Secondary | ICD-10-CM | POA: Diagnosis not present

## 2015-11-21 DIAGNOSIS — Z5189 Encounter for other specified aftercare: Secondary | ICD-10-CM | POA: Diagnosis not present

## 2015-11-21 DIAGNOSIS — Z6824 Body mass index (BMI) 24.0-24.9, adult: Secondary | ICD-10-CM | POA: Diagnosis not present

## 2015-11-24 DIAGNOSIS — I87319 Chronic venous hypertension (idiopathic) with ulcer of unspecified lower extremity: Secondary | ICD-10-CM | POA: Diagnosis not present

## 2015-11-24 DIAGNOSIS — I739 Peripheral vascular disease, unspecified: Secondary | ICD-10-CM | POA: Diagnosis not present

## 2015-11-24 DIAGNOSIS — Z6824 Body mass index (BMI) 24.0-24.9, adult: Secondary | ICD-10-CM | POA: Diagnosis not present

## 2015-11-24 DIAGNOSIS — Z5189 Encounter for other specified aftercare: Secondary | ICD-10-CM | POA: Diagnosis not present

## 2015-11-28 DIAGNOSIS — I739 Peripheral vascular disease, unspecified: Secondary | ICD-10-CM | POA: Diagnosis not present

## 2015-11-28 DIAGNOSIS — Z5189 Encounter for other specified aftercare: Secondary | ICD-10-CM | POA: Diagnosis not present

## 2015-11-28 DIAGNOSIS — I87319 Chronic venous hypertension (idiopathic) with ulcer of unspecified lower extremity: Secondary | ICD-10-CM | POA: Diagnosis not present

## 2015-11-28 DIAGNOSIS — Z6826 Body mass index (BMI) 26.0-26.9, adult: Secondary | ICD-10-CM | POA: Diagnosis not present

## 2015-12-01 DIAGNOSIS — Z6826 Body mass index (BMI) 26.0-26.9, adult: Secondary | ICD-10-CM | POA: Diagnosis not present

## 2015-12-01 DIAGNOSIS — I739 Peripheral vascular disease, unspecified: Secondary | ICD-10-CM | POA: Diagnosis not present

## 2015-12-01 DIAGNOSIS — I87319 Chronic venous hypertension (idiopathic) with ulcer of unspecified lower extremity: Secondary | ICD-10-CM | POA: Diagnosis not present

## 2015-12-01 DIAGNOSIS — Z5189 Encounter for other specified aftercare: Secondary | ICD-10-CM | POA: Diagnosis not present

## 2015-12-08 DIAGNOSIS — I87319 Chronic venous hypertension (idiopathic) with ulcer of unspecified lower extremity: Secondary | ICD-10-CM | POA: Diagnosis not present

## 2015-12-08 DIAGNOSIS — Z5189 Encounter for other specified aftercare: Secondary | ICD-10-CM | POA: Diagnosis not present

## 2015-12-08 DIAGNOSIS — I739 Peripheral vascular disease, unspecified: Secondary | ICD-10-CM | POA: Diagnosis not present

## 2015-12-08 DIAGNOSIS — Z6826 Body mass index (BMI) 26.0-26.9, adult: Secondary | ICD-10-CM | POA: Diagnosis not present

## 2015-12-15 DIAGNOSIS — I87319 Chronic venous hypertension (idiopathic) with ulcer of unspecified lower extremity: Secondary | ICD-10-CM | POA: Diagnosis not present

## 2015-12-15 DIAGNOSIS — I739 Peripheral vascular disease, unspecified: Secondary | ICD-10-CM | POA: Diagnosis not present

## 2015-12-15 DIAGNOSIS — I10 Essential (primary) hypertension: Secondary | ICD-10-CM | POA: Diagnosis not present

## 2015-12-15 DIAGNOSIS — E1159 Type 2 diabetes mellitus with other circulatory complications: Secondary | ICD-10-CM | POA: Diagnosis not present

## 2015-12-15 DIAGNOSIS — Z6826 Body mass index (BMI) 26.0-26.9, adult: Secondary | ICD-10-CM | POA: Diagnosis not present

## 2015-12-22 DIAGNOSIS — I87319 Chronic venous hypertension (idiopathic) with ulcer of unspecified lower extremity: Secondary | ICD-10-CM | POA: Diagnosis not present

## 2015-12-22 DIAGNOSIS — I739 Peripheral vascular disease, unspecified: Secondary | ICD-10-CM | POA: Diagnosis not present

## 2015-12-22 DIAGNOSIS — Z6826 Body mass index (BMI) 26.0-26.9, adult: Secondary | ICD-10-CM | POA: Diagnosis not present

## 2015-12-29 DIAGNOSIS — I10 Essential (primary) hypertension: Secondary | ICD-10-CM | POA: Diagnosis not present

## 2015-12-29 DIAGNOSIS — Z6826 Body mass index (BMI) 26.0-26.9, adult: Secondary | ICD-10-CM | POA: Diagnosis not present

## 2015-12-29 DIAGNOSIS — I87319 Chronic venous hypertension (idiopathic) with ulcer of unspecified lower extremity: Secondary | ICD-10-CM | POA: Diagnosis not present

## 2015-12-29 DIAGNOSIS — E1159 Type 2 diabetes mellitus with other circulatory complications: Secondary | ICD-10-CM | POA: Diagnosis not present

## 2015-12-29 DIAGNOSIS — I739 Peripheral vascular disease, unspecified: Secondary | ICD-10-CM | POA: Diagnosis not present

## 2016-01-05 DIAGNOSIS — I87319 Chronic venous hypertension (idiopathic) with ulcer of unspecified lower extremity: Secondary | ICD-10-CM | POA: Diagnosis not present

## 2016-01-05 DIAGNOSIS — Z6826 Body mass index (BMI) 26.0-26.9, adult: Secondary | ICD-10-CM | POA: Diagnosis not present

## 2016-01-05 DIAGNOSIS — Z5189 Encounter for other specified aftercare: Secondary | ICD-10-CM | POA: Diagnosis not present

## 2016-01-05 DIAGNOSIS — I739 Peripheral vascular disease, unspecified: Secondary | ICD-10-CM | POA: Diagnosis not present

## 2016-01-12 DIAGNOSIS — Z5189 Encounter for other specified aftercare: Secondary | ICD-10-CM | POA: Diagnosis not present

## 2016-01-12 DIAGNOSIS — I87319 Chronic venous hypertension (idiopathic) with ulcer of unspecified lower extremity: Secondary | ICD-10-CM | POA: Diagnosis not present

## 2016-01-19 DIAGNOSIS — E1159 Type 2 diabetes mellitus with other circulatory complications: Secondary | ICD-10-CM | POA: Diagnosis not present

## 2016-01-19 DIAGNOSIS — I8310 Varicose veins of unspecified lower extremity with inflammation: Secondary | ICD-10-CM | POA: Diagnosis not present

## 2016-01-19 DIAGNOSIS — Z6826 Body mass index (BMI) 26.0-26.9, adult: Secondary | ICD-10-CM | POA: Diagnosis not present

## 2016-01-19 DIAGNOSIS — I87319 Chronic venous hypertension (idiopathic) with ulcer of unspecified lower extremity: Secondary | ICD-10-CM | POA: Diagnosis not present

## 2016-01-19 DIAGNOSIS — I739 Peripheral vascular disease, unspecified: Secondary | ICD-10-CM | POA: Diagnosis not present

## 2016-01-26 DIAGNOSIS — I8310 Varicose veins of unspecified lower extremity with inflammation: Secondary | ICD-10-CM | POA: Diagnosis not present

## 2016-01-26 DIAGNOSIS — Z6826 Body mass index (BMI) 26.0-26.9, adult: Secondary | ICD-10-CM | POA: Diagnosis not present

## 2016-01-26 DIAGNOSIS — I87319 Chronic venous hypertension (idiopathic) with ulcer of unspecified lower extremity: Secondary | ICD-10-CM | POA: Diagnosis not present

## 2016-02-03 DIAGNOSIS — W57XXXA Bitten or stung by nonvenomous insect and other nonvenomous arthropods, initial encounter: Secondary | ICD-10-CM | POA: Diagnosis not present

## 2016-02-03 DIAGNOSIS — I739 Peripheral vascular disease, unspecified: Secondary | ICD-10-CM | POA: Diagnosis not present

## 2016-02-03 DIAGNOSIS — I10 Essential (primary) hypertension: Secondary | ICD-10-CM | POA: Diagnosis not present

## 2016-02-03 DIAGNOSIS — Z6827 Body mass index (BMI) 27.0-27.9, adult: Secondary | ICD-10-CM | POA: Diagnosis not present

## 2016-02-03 DIAGNOSIS — I8310 Varicose veins of unspecified lower extremity with inflammation: Secondary | ICD-10-CM | POA: Diagnosis not present

## 2016-02-17 DIAGNOSIS — Z6827 Body mass index (BMI) 27.0-27.9, adult: Secondary | ICD-10-CM | POA: Diagnosis not present

## 2016-02-17 DIAGNOSIS — I739 Peripheral vascular disease, unspecified: Secondary | ICD-10-CM | POA: Diagnosis not present

## 2016-02-17 DIAGNOSIS — E038 Other specified hypothyroidism: Secondary | ICD-10-CM | POA: Diagnosis not present

## 2016-02-17 DIAGNOSIS — I10 Essential (primary) hypertension: Secondary | ICD-10-CM | POA: Diagnosis not present

## 2016-02-17 DIAGNOSIS — E1159 Type 2 diabetes mellitus with other circulatory complications: Secondary | ICD-10-CM | POA: Diagnosis not present

## 2016-05-21 DIAGNOSIS — Z6828 Body mass index (BMI) 28.0-28.9, adult: Secondary | ICD-10-CM | POA: Diagnosis not present

## 2016-05-21 DIAGNOSIS — I8310 Varicose veins of unspecified lower extremity with inflammation: Secondary | ICD-10-CM | POA: Diagnosis not present

## 2016-05-21 DIAGNOSIS — E1159 Type 2 diabetes mellitus with other circulatory complications: Secondary | ICD-10-CM | POA: Diagnosis not present

## 2016-05-21 DIAGNOSIS — I10 Essential (primary) hypertension: Secondary | ICD-10-CM | POA: Diagnosis not present

## 2016-05-21 DIAGNOSIS — E038 Other specified hypothyroidism: Secondary | ICD-10-CM | POA: Diagnosis not present

## 2016-05-21 DIAGNOSIS — I739 Peripheral vascular disease, unspecified: Secondary | ICD-10-CM | POA: Diagnosis not present

## 2016-05-27 ENCOUNTER — Encounter (HOSPITAL_COMMUNITY): Payer: Self-pay | Admitting: *Deleted

## 2016-05-27 ENCOUNTER — Emergency Department (HOSPITAL_COMMUNITY)
Admission: EM | Admit: 2016-05-27 | Discharge: 2016-05-27 | Disposition: A | Discharge status: Home / Self Care | Payer: Medicare Other | Attending: Emergency Medicine | Admitting: Emergency Medicine

## 2016-05-27 DIAGNOSIS — Y939 Activity, unspecified: Secondary | ICD-10-CM | POA: Diagnosis not present

## 2016-05-27 DIAGNOSIS — S61411A Laceration without foreign body of right hand, initial encounter: Secondary | ICD-10-CM | POA: Diagnosis not present

## 2016-05-27 DIAGNOSIS — S61451A Open bite of right hand, initial encounter: Secondary | ICD-10-CM | POA: Insufficient documentation

## 2016-05-27 DIAGNOSIS — E119 Type 2 diabetes mellitus without complications: Secondary | ICD-10-CM | POA: Diagnosis not present

## 2016-05-27 DIAGNOSIS — Y929 Unspecified place or not applicable: Secondary | ICD-10-CM | POA: Diagnosis not present

## 2016-05-27 DIAGNOSIS — W5501XA Bitten by cat, initial encounter: Secondary | ICD-10-CM | POA: Insufficient documentation

## 2016-05-27 DIAGNOSIS — Z7984 Long term (current) use of oral hypoglycemic drugs: Secondary | ICD-10-CM | POA: Insufficient documentation

## 2016-05-27 DIAGNOSIS — Z794 Long term (current) use of insulin: Secondary | ICD-10-CM | POA: Insufficient documentation

## 2016-05-27 DIAGNOSIS — Y999 Unspecified external cause status: Secondary | ICD-10-CM | POA: Diagnosis not present

## 2016-05-27 HISTORY — DX: Type 2 diabetes mellitus without complications: E11.9

## 2016-05-27 LAB — CBG MONITORING, ED: GLUCOSE-CAPILLARY: 161 mg/dL — AB (ref 65–99)

## 2016-05-27 MED ORDER — AMOXICILLIN-POT CLAVULANATE 875-125 MG PO TABS: 1.0000 | ORAL_TABLET | Freq: Two times a day (BID) | ORAL | 0 refills | Status: DC

## 2016-05-27 MED ORDER — LIDOCAINE HCL (PF) 1 % IJ SOLN
30.0000 mL | Freq: Once | INTRAMUSCULAR | Status: AC
  Administered 2016-05-27: 30 mL via INTRADERMAL
  Filled 2016-05-27: qty 30

## 2016-05-27 NOTE — ED Notes (Signed)
Patient came in after her pet cat bit and scratched her hands and wrists. The cat has had all it's vaccinations and became angry after the patient's finger got caught in the cat's collar. The patient has bleeing lacerations to her hands and wrists, her right hand has deep lacerations. She can move all her fingers. Fingers are swollen. Wounds were cleansed by RN. Patient is a/ox4. Hx of DM2.

## 2016-05-27 NOTE — ED Provider Notes (Signed)
[facility] DEPT Provider Note   CSN: [id number] Arrival date & time: 05/27/16  1543     History   Chief Complaint Chief Complaint  Patient presents with  . Animal Bite    HPI Sarah Shelton is a 70 y.o. female.  Patient presents with multiple lacerations from cat scratch prior to arrival. Patient was trying to help the cat and finger got stuck in a collarr in the cat Biting and scratching. Cat shots are up-to-date. All wounds to the right fingers and hand. Patient's tetanus shot up-to-date. Wounds clean on arrival.      Past Medical History:  Diagnosis Date  . Diabetes mellitus without complication ([place])     There are no active problems to display for this patient.   History reviewed. No pertinent surgical history.  OB History    No data available       Home Medications    Prior to Admission medications   Medication Sig Start Date End Date Taking? Authorizing Provider  ibuprofen (ADVIL,MOTRIN) 200 MG tablet Take 400 mg by mouth every 6 (six) hours as needed (pain).   Yes Historical Provider, MD  Insulin Glargine (TOUJEO SOLOSTAR) 300 UNIT/ML SOPN Inject 24 Units into the skin at bedtime.   Yes Historical Provider, MD  metFORMIN (GLUCOPHAGE) 1000 MG tablet Take 1,000 mg by mouth 2 (two) times daily with a meal.   Yes Historical Provider, MD  PRESCRIPTION MEDICATION Take 1 tablet by mouth See admin instructions. Medication for high thyroid - take 1 tablet every morning except Sundays   Yes Historical Provider, MD  amoxicillin-clavulanate (AUGMENTIN) 875-125 MG tablet Take 1 tablet by mouth 2 (two) times daily. 05/27/16   Elnora Morrison, MD    Family History History reviewed. No pertinent family history.  Social History Social History  Substance Use Topics  . Smoking status: Never Smoker  . Smokeless tobacco: Not on file  . Alcohol use Not on file     Allergies   Adhesive [tape]   Review of Systems Review of Systems  Constitutional: Negative for  chills and fever.  Gastrointestinal: Negative for abdominal pain and vomiting.  Genitourinary: Negative for dysuria and flank pain.  Musculoskeletal: Negative for back pain, neck pain and neck stiffness.  Skin: Positive for wound. Negative for rash.  Neurological: Negative for light-headedness and headaches.     Physical Exam Updated Vital Signs BP 142/67 (BP Location: Left Arm)   Pulse 68   Temp 98.1 F (36.7 C) (Oral)   Resp 18   SpO2 100%   Physical Exam  Constitutional: She appears well-developed and well-nourished.  HENT:  Head: Normocephalic and atraumatic.  Neck: Normal range of motion.  Cardiovascular: Normal rate.   Pulmonary/Chest: Effort normal.  Musculoskeletal: Normal range of motion. She exhibits tenderness.  Neurological: She is alert.  Skin:  Patient has multiple superficial puncture scratches lacerations to the dorsum of the right hand. Patient has 3 larger wounds all superficial 1 approximately 2 cm diameter, another 1.5 cm and the third 1 cm. No active bleeding. Mild tender to palpation patient has full extension and flexion of digits right hand.  Nursing note and vitals reviewed.    ED Treatments / Results  Labs (all labs ordered are listed, but only abnormal results are displayed) Labs Reviewed  CBG MONITORING, ED - Abnormal; Notable for the following:       Result Value   Glucose-Capillary 161 (*)    All other components within normal limits    EKG  EKG Interpretation None       Radiology No results found.  Procedures Procedures (including critical care time) LACERATION REPAIR Performed by: Mariea Clonts Authorized by: Mariea Clonts Consent: Verbal consent obtained. Risks and benefits: risks, benefits and alternatives were discussed Consent given by: patient Patient identity confirmed: provided demographic data Prepped and Draped in normal sterile fashion Wound explored  Laceration Location: right hand Laceration Length:  2cm No Foreign Bodies seen or palpated Anesthesia: local infiltration Local anesthetic: lidocaine 1% w epinephrine Anesthetic total: 2 ml Amount of cleaning: standard  Skin closure: approximated Number of sutures: 3  Technique: interupted  Patient tolerance: Patient tolerated the procedure well with no immediate complications.  LACERATION REPAIR Performed by: Mariea Clonts Authorized by: Mariea Clonts Consent: Verbal consent obtained. Risks and benefits: risks, benefits and alternatives were discussed Consent given by: patient Patient identity confirmed: provided demographic data Prepped and Draped in normal sterile fashion Wound explored  Laceration Location: hand right Laceration Length: 1.5cm No Foreign Bodies seen or palpated Anesthesia: local infiltration Local anesthetic: lidocaine 1% w epinephrine Anesthetic total: 3 ml Amount of cleaning: standard  Skin closure: approximated Number of sutures: 3  Technique: inter  Patient tolerance: Patient tolerated the procedure well with no immediate complications.  LACERATION REPAIR Performed by: Mariea Clonts Authorized by: Mariea Clonts Consent: Verbal consent obtained. Risks and benefits: risks, benefits and alternatives were discussed Consent given by: patient Patient identity confirmed: provided demographic data Prepped and Draped in normal sterile fashion Wound explored  Laceration Location: hand right Laceration Length: 1cm No Foreign Bodies seen or palpated Anesthesia: local infiltration Local anesthetic: lidocaine 1% w epinephrine Anesthetic total: 2 ml Amount of cleaning: standard  Skin closure: approximated Number of sutures: 2  Technique: inter  Patient tolerance: Patient tolerated the procedure well with no immediate complications.   Medications Ordered in ED Medications  lidocaine (PF) (XYLOCAINE) 1 % injection 30 mL (30 mLs Intradermal Given 05/27/16 1838)     Initial  Impression / Assessment and Plan / ED Course  I have reviewed the triage vital signs and the nursing notes.  Pertinent labs & imaging results that were available during my care of the patient were reviewed by me and considered in my medical decision making (see chart for details).  Clinical Course   Cat bite, vaccines up-to-date. Wounds cleaned thoroughly and 3 lacerations repaired. Follow-up discussed.  Results and differential diagnosis were discussed with the patient/parent/guardian. Xrays were independently reviewed by myself.  Close follow up outpatient was discussed, comfortable with the plan.   Medications  lidocaine (PF) (XYLOCAINE) 1 % injection 30 mL (30 mLs Intradermal Given 05/27/16 1838)    Vitals:   05/27/16 1845 05/27/16 1900 05/27/16 1930 05/27/16 2035  BP: 165/58 160/60 166/65 142/67  Pulse: (!) 58 (!) 59 65 68  Resp:    18  Temp:    98.1 F (36.7 C)  TempSrc:    Oral  SpO2: 100% 100% 100% 100%    Final diagnoses:  Cat bite, initial encounter  Hand laceration, right, initial encounter     Final Clinical Impressions(s) / ED Diagnoses   Final diagnoses:  Cat bite, initial encounter  Hand laceration, right, initial encounter    New Prescriptions New Prescriptions   AMOXICILLIN-CLAVULANATE (AUGMENTIN) 875-125 MG TABLET    Take 1 tablet by mouth 2 (two) times daily.     Elnora Morrison, MD 05/27/16 2044

## 2016-05-27 NOTE — ED Notes (Signed)
Pt stable, ambulatory, states understanding of discharge instructions 

## 2016-05-27 NOTE — ED Triage Notes (Signed)
Pt reports her cat scratching and biting her today while trying to grab cat by collar to get him back inside. Pt has multiple cuts and scratches to bilateral hands, larger cuts noted to right hand. Moderate bleeding was noted to right hand, clean bandages applied on arrival and bleeding controlled.

## 2016-05-27 NOTE — Discharge Instructions (Signed)
Sutures out in approximately 10 days.  Keep wounds clean.Marland Kitchen Antibiotics. Return for signs of infection.   Thank you

## 2016-05-31 DIAGNOSIS — S61432A Puncture wound without foreign body of left hand, initial encounter: Secondary | ICD-10-CM | POA: Diagnosis not present

## 2016-05-31 DIAGNOSIS — W5501XA Bitten by cat, initial encounter: Secondary | ICD-10-CM | POA: Diagnosis not present

## 2016-05-31 DIAGNOSIS — S61431A Puncture wound without foreign body of right hand, initial encounter: Secondary | ICD-10-CM | POA: Diagnosis not present

## 2016-05-31 DIAGNOSIS — R2233 Localized swelling, mass and lump, upper limb, bilateral: Secondary | ICD-10-CM | POA: Diagnosis not present

## 2016-05-31 DIAGNOSIS — Z6827 Body mass index (BMI) 27.0-27.9, adult: Secondary | ICD-10-CM | POA: Diagnosis not present

## 2016-05-31 DIAGNOSIS — L0889 Other specified local infections of the skin and subcutaneous tissue: Secondary | ICD-10-CM | POA: Diagnosis not present

## 2016-05-31 DIAGNOSIS — S60572A Other superficial bite of hand of left hand, initial encounter: Secondary | ICD-10-CM | POA: Diagnosis not present

## 2016-05-31 DIAGNOSIS — S60571A Other superficial bite of hand of right hand, initial encounter: Secondary | ICD-10-CM | POA: Diagnosis not present

## 2016-06-04 DIAGNOSIS — W5501XS Bitten by cat, sequela: Secondary | ICD-10-CM | POA: Diagnosis not present

## 2016-06-04 DIAGNOSIS — Z6828 Body mass index (BMI) 28.0-28.9, adult: Secondary | ICD-10-CM | POA: Diagnosis not present

## 2016-06-04 DIAGNOSIS — S61431D Puncture wound without foreign body of right hand, subsequent encounter: Secondary | ICD-10-CM | POA: Diagnosis not present

## 2016-06-04 DIAGNOSIS — S61432D Puncture wound without foreign body of left hand, subsequent encounter: Secondary | ICD-10-CM | POA: Diagnosis not present

## 2016-06-04 DIAGNOSIS — Z4802 Encounter for removal of sutures: Secondary | ICD-10-CM | POA: Diagnosis not present

## 2016-06-04 DIAGNOSIS — R2233 Localized swelling, mass and lump, upper limb, bilateral: Secondary | ICD-10-CM | POA: Diagnosis not present

## 2016-08-20 DIAGNOSIS — E038 Other specified hypothyroidism: Secondary | ICD-10-CM | POA: Diagnosis not present

## 2016-08-20 DIAGNOSIS — E1159 Type 2 diabetes mellitus with other circulatory complications: Secondary | ICD-10-CM | POA: Diagnosis not present

## 2016-08-20 DIAGNOSIS — E1165 Type 2 diabetes mellitus with hyperglycemia: Secondary | ICD-10-CM | POA: Diagnosis not present

## 2016-08-20 DIAGNOSIS — I739 Peripheral vascular disease, unspecified: Secondary | ICD-10-CM | POA: Diagnosis not present

## 2016-08-20 DIAGNOSIS — M67942 Unspecified disorder of synovium and tendon, left hand: Secondary | ICD-10-CM | POA: Diagnosis not present

## 2016-08-20 DIAGNOSIS — I10 Essential (primary) hypertension: Secondary | ICD-10-CM | POA: Diagnosis not present

## 2016-08-31 ENCOUNTER — Other Ambulatory Visit (HOSPITAL_COMMUNITY): Payer: Self-pay | Admitting: Orthopedic Surgery

## 2016-08-31 DIAGNOSIS — IMO0002 Reserved for concepts with insufficient information to code with codable children: Secondary | ICD-10-CM

## 2016-08-31 DIAGNOSIS — S66191A Other injury of flexor muscle, fascia and tendon of left index finger at wrist and hand level, initial encounter: Secondary | ICD-10-CM | POA: Diagnosis not present

## 2016-08-31 DIAGNOSIS — T148XXA Other injury of unspecified body region, initial encounter: Secondary | ICD-10-CM

## 2016-08-31 DIAGNOSIS — M25642 Stiffness of left hand, not elsewhere classified: Secondary | ICD-10-CM | POA: Diagnosis not present

## 2016-09-05 ENCOUNTER — Ambulatory Visit (HOSPITAL_COMMUNITY): Payer: Medicare Other

## 2016-10-19 DIAGNOSIS — E1165 Type 2 diabetes mellitus with hyperglycemia: Secondary | ICD-10-CM | POA: Diagnosis not present

## 2016-10-19 DIAGNOSIS — Z6828 Body mass index (BMI) 28.0-28.9, adult: Secondary | ICD-10-CM | POA: Diagnosis not present

## 2016-10-19 DIAGNOSIS — E1159 Type 2 diabetes mellitus with other circulatory complications: Secondary | ICD-10-CM | POA: Diagnosis not present

## 2016-10-19 DIAGNOSIS — I739 Peripheral vascular disease, unspecified: Secondary | ICD-10-CM | POA: Diagnosis not present

## 2016-10-19 DIAGNOSIS — M67942 Unspecified disorder of synovium and tendon, left hand: Secondary | ICD-10-CM | POA: Diagnosis not present

## 2016-10-19 DIAGNOSIS — E038 Other specified hypothyroidism: Secondary | ICD-10-CM | POA: Diagnosis not present

## 2016-10-19 DIAGNOSIS — I10 Essential (primary) hypertension: Secondary | ICD-10-CM | POA: Diagnosis not present

## 2017-02-27 DIAGNOSIS — Z1389 Encounter for screening for other disorder: Secondary | ICD-10-CM | POA: Diagnosis not present

## 2017-02-27 DIAGNOSIS — E038 Other specified hypothyroidism: Secondary | ICD-10-CM | POA: Diagnosis not present

## 2017-02-27 DIAGNOSIS — Z6827 Body mass index (BMI) 27.0-27.9, adult: Secondary | ICD-10-CM | POA: Diagnosis not present

## 2017-02-27 DIAGNOSIS — I7389 Other specified peripheral vascular diseases: Secondary | ICD-10-CM | POA: Diagnosis not present

## 2017-02-27 DIAGNOSIS — R269 Unspecified abnormalities of gait and mobility: Secondary | ICD-10-CM | POA: Diagnosis not present

## 2017-02-27 DIAGNOSIS — E784 Other hyperlipidemia: Secondary | ICD-10-CM | POA: Diagnosis not present

## 2017-02-27 DIAGNOSIS — E1159 Type 2 diabetes mellitus with other circulatory complications: Secondary | ICD-10-CM | POA: Diagnosis not present

## 2017-02-27 DIAGNOSIS — I10 Essential (primary) hypertension: Secondary | ICD-10-CM | POA: Diagnosis not present

## 2017-08-01 DIAGNOSIS — R269 Unspecified abnormalities of gait and mobility: Secondary | ICD-10-CM | POA: Diagnosis not present

## 2017-08-01 DIAGNOSIS — E038 Other specified hypothyroidism: Secondary | ICD-10-CM | POA: Diagnosis not present

## 2017-08-01 DIAGNOSIS — I10 Essential (primary) hypertension: Secondary | ICD-10-CM | POA: Diagnosis not present

## 2017-08-01 DIAGNOSIS — E1165 Type 2 diabetes mellitus with hyperglycemia: Secondary | ICD-10-CM | POA: Diagnosis not present

## 2017-08-01 DIAGNOSIS — Z6829 Body mass index (BMI) 29.0-29.9, adult: Secondary | ICD-10-CM | POA: Diagnosis not present

## 2017-08-01 DIAGNOSIS — E7849 Other hyperlipidemia: Secondary | ICD-10-CM | POA: Diagnosis not present

## 2017-12-06 DIAGNOSIS — E1159 Type 2 diabetes mellitus with other circulatory complications: Secondary | ICD-10-CM | POA: Diagnosis not present

## 2017-12-06 DIAGNOSIS — I10 Essential (primary) hypertension: Secondary | ICD-10-CM | POA: Diagnosis not present

## 2017-12-06 DIAGNOSIS — Z6829 Body mass index (BMI) 29.0-29.9, adult: Secondary | ICD-10-CM | POA: Diagnosis not present

## 2017-12-06 DIAGNOSIS — E038 Other specified hypothyroidism: Secondary | ICD-10-CM | POA: Diagnosis not present

## 2017-12-06 DIAGNOSIS — Z1389 Encounter for screening for other disorder: Secondary | ICD-10-CM | POA: Diagnosis not present

## 2017-12-06 DIAGNOSIS — E785 Hyperlipidemia, unspecified: Secondary | ICD-10-CM | POA: Diagnosis not present

## 2017-12-06 DIAGNOSIS — I739 Peripheral vascular disease, unspecified: Secondary | ICD-10-CM | POA: Diagnosis not present

## 2017-12-06 DIAGNOSIS — M2550 Pain in unspecified joint: Secondary | ICD-10-CM | POA: Diagnosis not present

## 2018-04-08 DIAGNOSIS — Z794 Long term (current) use of insulin: Secondary | ICD-10-CM | POA: Diagnosis not present

## 2018-04-08 DIAGNOSIS — E1159 Type 2 diabetes mellitus with other circulatory complications: Secondary | ICD-10-CM | POA: Diagnosis not present

## 2018-04-08 DIAGNOSIS — I7389 Other specified peripheral vascular diseases: Secondary | ICD-10-CM | POA: Diagnosis not present

## 2018-04-08 DIAGNOSIS — E7849 Other hyperlipidemia: Secondary | ICD-10-CM | POA: Diagnosis not present

## 2018-04-08 DIAGNOSIS — I10 Essential (primary) hypertension: Secondary | ICD-10-CM | POA: Diagnosis not present

## 2018-04-08 DIAGNOSIS — R2689 Other abnormalities of gait and mobility: Secondary | ICD-10-CM | POA: Diagnosis not present

## 2018-04-08 DIAGNOSIS — Z6829 Body mass index (BMI) 29.0-29.9, adult: Secondary | ICD-10-CM | POA: Diagnosis not present

## 2018-04-08 DIAGNOSIS — E038 Other specified hypothyroidism: Secondary | ICD-10-CM | POA: Diagnosis not present

## 2018-08-06 DIAGNOSIS — Z6828 Body mass index (BMI) 28.0-28.9, adult: Secondary | ICD-10-CM | POA: Diagnosis not present

## 2018-08-06 DIAGNOSIS — E1159 Type 2 diabetes mellitus with other circulatory complications: Secondary | ICD-10-CM | POA: Diagnosis not present

## 2018-08-06 DIAGNOSIS — E7849 Other hyperlipidemia: Secondary | ICD-10-CM | POA: Diagnosis not present

## 2018-08-06 DIAGNOSIS — Z794 Long term (current) use of insulin: Secondary | ICD-10-CM | POA: Diagnosis not present

## 2018-08-06 DIAGNOSIS — I10 Essential (primary) hypertension: Secondary | ICD-10-CM | POA: Diagnosis not present

## 2018-08-06 DIAGNOSIS — E038 Other specified hypothyroidism: Secondary | ICD-10-CM | POA: Diagnosis not present

## 2018-12-22 DIAGNOSIS — Z794 Long term (current) use of insulin: Secondary | ICD-10-CM | POA: Diagnosis not present

## 2018-12-22 DIAGNOSIS — E1165 Type 2 diabetes mellitus with hyperglycemia: Secondary | ICD-10-CM | POA: Diagnosis not present

## 2018-12-22 DIAGNOSIS — E559 Vitamin D deficiency, unspecified: Secondary | ICD-10-CM | POA: Diagnosis not present

## 2018-12-22 DIAGNOSIS — R269 Unspecified abnormalities of gait and mobility: Secondary | ICD-10-CM | POA: Diagnosis not present

## 2018-12-22 DIAGNOSIS — E785 Hyperlipidemia, unspecified: Secondary | ICD-10-CM | POA: Diagnosis not present

## 2018-12-22 DIAGNOSIS — I10 Essential (primary) hypertension: Secondary | ICD-10-CM | POA: Diagnosis not present

## 2018-12-22 DIAGNOSIS — E039 Hypothyroidism, unspecified: Secondary | ICD-10-CM | POA: Diagnosis not present

## 2018-12-26 DIAGNOSIS — E1165 Type 2 diabetes mellitus with hyperglycemia: Secondary | ICD-10-CM | POA: Diagnosis not present

## 2018-12-26 DIAGNOSIS — E7849 Other hyperlipidemia: Secondary | ICD-10-CM | POA: Diagnosis not present

## 2018-12-26 DIAGNOSIS — E038 Other specified hypothyroidism: Secondary | ICD-10-CM | POA: Diagnosis not present

## 2018-12-26 DIAGNOSIS — I10 Essential (primary) hypertension: Secondary | ICD-10-CM | POA: Diagnosis not present

## 2018-12-26 DIAGNOSIS — E559 Vitamin D deficiency, unspecified: Secondary | ICD-10-CM | POA: Diagnosis not present

## 2019-03-19 DIAGNOSIS — E119 Type 2 diabetes mellitus without complications: Secondary | ICD-10-CM | POA: Diagnosis not present

## 2019-03-19 DIAGNOSIS — H524 Presbyopia: Secondary | ICD-10-CM | POA: Diagnosis not present

## 2019-04-08 DIAGNOSIS — R269 Unspecified abnormalities of gait and mobility: Secondary | ICD-10-CM | POA: Diagnosis not present

## 2019-04-08 DIAGNOSIS — Z794 Long term (current) use of insulin: Secondary | ICD-10-CM | POA: Diagnosis not present

## 2019-04-08 DIAGNOSIS — G4762 Sleep related leg cramps: Secondary | ICD-10-CM | POA: Diagnosis not present

## 2019-04-08 DIAGNOSIS — E039 Hypothyroidism, unspecified: Secondary | ICD-10-CM | POA: Diagnosis not present

## 2019-04-08 DIAGNOSIS — E559 Vitamin D deficiency, unspecified: Secondary | ICD-10-CM | POA: Diagnosis not present

## 2019-04-08 DIAGNOSIS — E785 Hyperlipidemia, unspecified: Secondary | ICD-10-CM | POA: Diagnosis not present

## 2019-04-08 DIAGNOSIS — I10 Essential (primary) hypertension: Secondary | ICD-10-CM | POA: Diagnosis not present

## 2019-04-08 DIAGNOSIS — E1165 Type 2 diabetes mellitus with hyperglycemia: Secondary | ICD-10-CM | POA: Diagnosis not present

## 2019-04-08 DIAGNOSIS — M5416 Radiculopathy, lumbar region: Secondary | ICD-10-CM | POA: Diagnosis not present

## 2019-11-18 DIAGNOSIS — S81801A Unspecified open wound, right lower leg, initial encounter: Secondary | ICD-10-CM | POA: Diagnosis not present

## 2019-11-18 DIAGNOSIS — L989 Disorder of the skin and subcutaneous tissue, unspecified: Secondary | ICD-10-CM | POA: Diagnosis not present

## 2019-11-18 DIAGNOSIS — W5503XA Scratched by cat, initial encounter: Secondary | ICD-10-CM | POA: Diagnosis not present

## 2019-11-18 DIAGNOSIS — L03119 Cellulitis of unspecified part of limb: Secondary | ICD-10-CM | POA: Diagnosis not present

## 2019-11-18 DIAGNOSIS — S81802A Unspecified open wound, left lower leg, initial encounter: Secondary | ICD-10-CM | POA: Diagnosis not present

## 2019-11-18 DIAGNOSIS — I739 Peripheral vascular disease, unspecified: Secondary | ICD-10-CM | POA: Diagnosis not present

## 2019-12-09 DIAGNOSIS — D485 Neoplasm of uncertain behavior of skin: Secondary | ICD-10-CM | POA: Diagnosis not present

## 2019-12-09 DIAGNOSIS — L570 Actinic keratosis: Secondary | ICD-10-CM | POA: Diagnosis not present

## 2019-12-11 DIAGNOSIS — R0989 Other specified symptoms and signs involving the circulatory and respiratory systems: Secondary | ICD-10-CM | POA: Diagnosis not present

## 2019-12-11 DIAGNOSIS — Z794 Long term (current) use of insulin: Secondary | ICD-10-CM | POA: Diagnosis not present

## 2019-12-11 DIAGNOSIS — E7849 Other hyperlipidemia: Secondary | ICD-10-CM | POA: Diagnosis not present

## 2019-12-11 DIAGNOSIS — M5416 Radiculopathy, lumbar region: Secondary | ICD-10-CM | POA: Diagnosis not present

## 2019-12-11 DIAGNOSIS — I10 Essential (primary) hypertension: Secondary | ICD-10-CM | POA: Diagnosis not present

## 2019-12-11 DIAGNOSIS — I517 Cardiomegaly: Secondary | ICD-10-CM | POA: Diagnosis not present

## 2019-12-11 DIAGNOSIS — E559 Vitamin D deficiency, unspecified: Secondary | ICD-10-CM | POA: Diagnosis not present

## 2019-12-11 DIAGNOSIS — L989 Disorder of the skin and subcutaneous tissue, unspecified: Secondary | ICD-10-CM | POA: Diagnosis not present

## 2019-12-11 DIAGNOSIS — E1159 Type 2 diabetes mellitus with other circulatory complications: Secondary | ICD-10-CM | POA: Diagnosis not present

## 2019-12-11 DIAGNOSIS — R269 Unspecified abnormalities of gait and mobility: Secondary | ICD-10-CM | POA: Diagnosis not present

## 2019-12-11 DIAGNOSIS — E038 Other specified hypothyroidism: Secondary | ICD-10-CM | POA: Diagnosis not present

## 2019-12-17 DIAGNOSIS — I10 Essential (primary) hypertension: Secondary | ICD-10-CM | POA: Diagnosis not present

## 2019-12-17 DIAGNOSIS — E1159 Type 2 diabetes mellitus with other circulatory complications: Secondary | ICD-10-CM | POA: Diagnosis not present

## 2019-12-17 DIAGNOSIS — Z794 Long term (current) use of insulin: Secondary | ICD-10-CM | POA: Diagnosis not present

## 2019-12-24 ENCOUNTER — Ambulatory Visit (HOSPITAL_COMMUNITY)
Admission: RE | Admit: 2019-12-24 | Discharge: 2019-12-24 | Disposition: A | Discharge status: Home / Self Care | Payer: Medicare Other | Source: Ambulatory Visit | Attending: Surgery | Admitting: Surgery

## 2019-12-24 ENCOUNTER — Other Ambulatory Visit (HOSPITAL_COMMUNITY): Payer: Self-pay | Admitting: Endocrinology

## 2019-12-24 ENCOUNTER — Other Ambulatory Visit: Payer: Self-pay

## 2019-12-24 DIAGNOSIS — R0989 Other specified symptoms and signs involving the circulatory and respiratory systems: Secondary | ICD-10-CM

## 2020-02-16 DIAGNOSIS — L570 Actinic keratosis: Secondary | ICD-10-CM | POA: Diagnosis not present

## 2020-03-17 DIAGNOSIS — E1159 Type 2 diabetes mellitus with other circulatory complications: Secondary | ICD-10-CM | POA: Diagnosis not present

## 2020-03-17 DIAGNOSIS — I10 Essential (primary) hypertension: Secondary | ICD-10-CM | POA: Diagnosis not present

## 2020-03-17 DIAGNOSIS — Z794 Long term (current) use of insulin: Secondary | ICD-10-CM | POA: Diagnosis not present

## 2020-03-18 DIAGNOSIS — H524 Presbyopia: Secondary | ICD-10-CM | POA: Diagnosis not present

## 2020-03-18 DIAGNOSIS — D3132 Benign neoplasm of left choroid: Secondary | ICD-10-CM | POA: Diagnosis not present

## 2020-03-18 DIAGNOSIS — H52223 Regular astigmatism, bilateral: Secondary | ICD-10-CM | POA: Diagnosis not present

## 2020-03-18 DIAGNOSIS — E113393 Type 2 diabetes mellitus with moderate nonproliferative diabetic retinopathy without macular edema, bilateral: Secondary | ICD-10-CM | POA: Diagnosis not present

## 2020-03-29 DIAGNOSIS — H90A21 Sensorineural hearing loss, unilateral, right ear, with restricted hearing on the contralateral side: Secondary | ICD-10-CM | POA: Diagnosis not present

## 2020-04-07 DIAGNOSIS — E785 Hyperlipidemia, unspecified: Secondary | ICD-10-CM | POA: Diagnosis not present

## 2020-04-07 DIAGNOSIS — I6523 Occlusion and stenosis of bilateral carotid arteries: Secondary | ICD-10-CM | POA: Diagnosis not present

## 2020-04-07 DIAGNOSIS — R2681 Unsteadiness on feet: Secondary | ICD-10-CM | POA: Diagnosis not present

## 2020-04-07 DIAGNOSIS — Z794 Long term (current) use of insulin: Secondary | ICD-10-CM | POA: Diagnosis not present

## 2020-04-07 DIAGNOSIS — E039 Hypothyroidism, unspecified: Secondary | ICD-10-CM | POA: Diagnosis not present

## 2020-04-07 DIAGNOSIS — I10 Essential (primary) hypertension: Secondary | ICD-10-CM | POA: Diagnosis not present

## 2020-04-07 DIAGNOSIS — M5416 Radiculopathy, lumbar region: Secondary | ICD-10-CM | POA: Diagnosis not present

## 2020-04-07 DIAGNOSIS — E1142 Type 2 diabetes mellitus with diabetic polyneuropathy: Secondary | ICD-10-CM | POA: Diagnosis not present

## 2020-04-07 DIAGNOSIS — E559 Vitamin D deficiency, unspecified: Secondary | ICD-10-CM | POA: Diagnosis not present

## 2020-04-07 DIAGNOSIS — E1149 Type 2 diabetes mellitus with other diabetic neurological complication: Secondary | ICD-10-CM | POA: Diagnosis not present

## 2020-04-21 DIAGNOSIS — Z20822 Contact with and (suspected) exposure to covid-19: Secondary | ICD-10-CM | POA: Diagnosis not present

## 2020-04-21 DIAGNOSIS — Z03818 Encounter for observation for suspected exposure to other biological agents ruled out: Secondary | ICD-10-CM | POA: Diagnosis not present

## 2020-06-02 DIAGNOSIS — L570 Actinic keratosis: Secondary | ICD-10-CM | POA: Diagnosis not present

## 2020-06-02 DIAGNOSIS — D485 Neoplasm of uncertain behavior of skin: Secondary | ICD-10-CM | POA: Diagnosis not present

## 2020-06-15 DIAGNOSIS — E1159 Type 2 diabetes mellitus with other circulatory complications: Secondary | ICD-10-CM | POA: Diagnosis not present

## 2020-06-15 DIAGNOSIS — I10 Essential (primary) hypertension: Secondary | ICD-10-CM | POA: Diagnosis not present

## 2020-06-15 DIAGNOSIS — Z794 Long term (current) use of insulin: Secondary | ICD-10-CM | POA: Diagnosis not present

## 2020-07-25 DIAGNOSIS — D3132 Benign neoplasm of left choroid: Secondary | ICD-10-CM | POA: Diagnosis not present

## 2020-07-25 DIAGNOSIS — E113311 Type 2 diabetes mellitus with moderate nonproliferative diabetic retinopathy with macular edema, right eye: Secondary | ICD-10-CM | POA: Diagnosis not present

## 2020-07-25 DIAGNOSIS — E113392 Type 2 diabetes mellitus with moderate nonproliferative diabetic retinopathy without macular edema, left eye: Secondary | ICD-10-CM | POA: Diagnosis not present

## 2020-07-25 DIAGNOSIS — H43813 Vitreous degeneration, bilateral: Secondary | ICD-10-CM | POA: Diagnosis not present

## 2020-07-27 DIAGNOSIS — L0889 Other specified local infections of the skin and subcutaneous tissue: Secondary | ICD-10-CM | POA: Diagnosis not present

## 2020-07-27 DIAGNOSIS — L570 Actinic keratosis: Secondary | ICD-10-CM | POA: Diagnosis not present

## 2020-08-08 DIAGNOSIS — E1142 Type 2 diabetes mellitus with diabetic polyneuropathy: Secondary | ICD-10-CM | POA: Diagnosis not present

## 2020-08-08 DIAGNOSIS — E1159 Type 2 diabetes mellitus with other circulatory complications: Secondary | ICD-10-CM | POA: Diagnosis not present

## 2020-08-08 DIAGNOSIS — I358 Other nonrheumatic aortic valve disorders: Secondary | ICD-10-CM | POA: Diagnosis not present

## 2020-08-08 DIAGNOSIS — E039 Hypothyroidism, unspecified: Secondary | ICD-10-CM | POA: Diagnosis not present

## 2020-08-08 DIAGNOSIS — I10 Essential (primary) hypertension: Secondary | ICD-10-CM | POA: Diagnosis not present

## 2020-08-08 DIAGNOSIS — E559 Vitamin D deficiency, unspecified: Secondary | ICD-10-CM | POA: Diagnosis not present

## 2020-08-08 DIAGNOSIS — E785 Hyperlipidemia, unspecified: Secondary | ICD-10-CM | POA: Diagnosis not present

## 2020-08-08 DIAGNOSIS — I6523 Occlusion and stenosis of bilateral carotid arteries: Secondary | ICD-10-CM | POA: Diagnosis not present

## 2020-08-08 DIAGNOSIS — Z794 Long term (current) use of insulin: Secondary | ICD-10-CM | POA: Diagnosis not present

## 2020-08-08 DIAGNOSIS — R2681 Unsteadiness on feet: Secondary | ICD-10-CM | POA: Diagnosis not present

## 2020-08-25 DIAGNOSIS — L97211 Non-pressure chronic ulcer of right calf limited to breakdown of skin: Secondary | ICD-10-CM | POA: Diagnosis not present

## 2020-09-15 DIAGNOSIS — I10 Essential (primary) hypertension: Secondary | ICD-10-CM | POA: Diagnosis not present

## 2020-09-15 DIAGNOSIS — S81801A Unspecified open wound, right lower leg, initial encounter: Secondary | ICD-10-CM | POA: Diagnosis not present

## 2020-09-15 DIAGNOSIS — E1159 Type 2 diabetes mellitus with other circulatory complications: Secondary | ICD-10-CM | POA: Diagnosis not present

## 2020-09-15 DIAGNOSIS — Z794 Long term (current) use of insulin: Secondary | ICD-10-CM | POA: Diagnosis not present

## 2020-09-16 DIAGNOSIS — L03115 Cellulitis of right lower limb: Secondary | ICD-10-CM | POA: Diagnosis not present

## 2020-09-16 DIAGNOSIS — L97919 Non-pressure chronic ulcer of unspecified part of right lower leg with unspecified severity: Secondary | ICD-10-CM | POA: Diagnosis not present

## 2020-09-16 DIAGNOSIS — I87331 Chronic venous hypertension (idiopathic) with ulcer and inflammation of right lower extremity: Secondary | ICD-10-CM | POA: Diagnosis not present

## 2020-09-16 DIAGNOSIS — I872 Venous insufficiency (chronic) (peripheral): Secondary | ICD-10-CM | POA: Diagnosis not present

## 2020-09-23 DIAGNOSIS — L97919 Non-pressure chronic ulcer of unspecified part of right lower leg with unspecified severity: Secondary | ICD-10-CM | POA: Diagnosis not present

## 2020-09-23 DIAGNOSIS — I872 Venous insufficiency (chronic) (peripheral): Secondary | ICD-10-CM | POA: Diagnosis not present

## 2020-09-23 DIAGNOSIS — I87331 Chronic venous hypertension (idiopathic) with ulcer and inflammation of right lower extremity: Secondary | ICD-10-CM | POA: Diagnosis not present

## 2020-09-23 DIAGNOSIS — L03115 Cellulitis of right lower limb: Secondary | ICD-10-CM | POA: Diagnosis not present

## 2020-09-30 DIAGNOSIS — I872 Venous insufficiency (chronic) (peripheral): Secondary | ICD-10-CM | POA: Diagnosis not present

## 2020-09-30 DIAGNOSIS — L03115 Cellulitis of right lower limb: Secondary | ICD-10-CM | POA: Diagnosis not present

## 2020-09-30 DIAGNOSIS — E11649 Type 2 diabetes mellitus with hypoglycemia without coma: Secondary | ICD-10-CM | POA: Diagnosis not present

## 2020-09-30 DIAGNOSIS — L97919 Non-pressure chronic ulcer of unspecified part of right lower leg with unspecified severity: Secondary | ICD-10-CM | POA: Diagnosis not present

## 2020-09-30 DIAGNOSIS — I87331 Chronic venous hypertension (idiopathic) with ulcer and inflammation of right lower extremity: Secondary | ICD-10-CM | POA: Diagnosis not present

## 2020-10-05 ENCOUNTER — Other Ambulatory Visit (HOSPITAL_COMMUNITY): Payer: Self-pay | Admitting: Endocrinology

## 2020-10-05 DIAGNOSIS — I358 Other nonrheumatic aortic valve disorders: Secondary | ICD-10-CM

## 2020-10-07 DIAGNOSIS — L03115 Cellulitis of right lower limb: Secondary | ICD-10-CM | POA: Diagnosis not present

## 2020-10-07 DIAGNOSIS — I87331 Chronic venous hypertension (idiopathic) with ulcer and inflammation of right lower extremity: Secondary | ICD-10-CM | POA: Diagnosis not present

## 2020-10-07 DIAGNOSIS — I872 Venous insufficiency (chronic) (peripheral): Secondary | ICD-10-CM | POA: Diagnosis not present

## 2020-10-07 DIAGNOSIS — L97919 Non-pressure chronic ulcer of unspecified part of right lower leg with unspecified severity: Secondary | ICD-10-CM | POA: Diagnosis not present

## 2020-10-14 DIAGNOSIS — L03115 Cellulitis of right lower limb: Secondary | ICD-10-CM | POA: Diagnosis not present

## 2020-10-14 DIAGNOSIS — I872 Venous insufficiency (chronic) (peripheral): Secondary | ICD-10-CM | POA: Diagnosis not present

## 2020-10-14 DIAGNOSIS — L97919 Non-pressure chronic ulcer of unspecified part of right lower leg with unspecified severity: Secondary | ICD-10-CM | POA: Diagnosis not present

## 2020-10-14 DIAGNOSIS — I87331 Chronic venous hypertension (idiopathic) with ulcer and inflammation of right lower extremity: Secondary | ICD-10-CM | POA: Diagnosis not present

## 2020-10-21 DIAGNOSIS — I87331 Chronic venous hypertension (idiopathic) with ulcer and inflammation of right lower extremity: Secondary | ICD-10-CM | POA: Diagnosis not present

## 2020-10-21 DIAGNOSIS — I872 Venous insufficiency (chronic) (peripheral): Secondary | ICD-10-CM | POA: Diagnosis not present

## 2020-10-21 DIAGNOSIS — L97919 Non-pressure chronic ulcer of unspecified part of right lower leg with unspecified severity: Secondary | ICD-10-CM | POA: Diagnosis not present

## 2020-10-21 DIAGNOSIS — L03115 Cellulitis of right lower limb: Secondary | ICD-10-CM | POA: Diagnosis not present

## 2020-10-28 DIAGNOSIS — L03115 Cellulitis of right lower limb: Secondary | ICD-10-CM | POA: Diagnosis not present

## 2020-10-28 DIAGNOSIS — I872 Venous insufficiency (chronic) (peripheral): Secondary | ICD-10-CM | POA: Diagnosis not present

## 2020-10-28 DIAGNOSIS — L97919 Non-pressure chronic ulcer of unspecified part of right lower leg with unspecified severity: Secondary | ICD-10-CM | POA: Diagnosis not present

## 2020-10-28 DIAGNOSIS — I10 Essential (primary) hypertension: Secondary | ICD-10-CM | POA: Diagnosis not present

## 2020-10-28 DIAGNOSIS — I87331 Chronic venous hypertension (idiopathic) with ulcer and inflammation of right lower extremity: Secondary | ICD-10-CM | POA: Diagnosis not present

## 2020-11-03 ENCOUNTER — Other Ambulatory Visit: Payer: Self-pay

## 2020-11-03 ENCOUNTER — Ambulatory Visit (HOSPITAL_COMMUNITY): Payer: Medicare Other | Attending: Cardiology

## 2020-11-03 DIAGNOSIS — I358 Other nonrheumatic aortic valve disorders: Secondary | ICD-10-CM | POA: Insufficient documentation

## 2020-11-03 LAB — ECHOCARDIOGRAM COMPLETE
AR max vel: 0.67 cm2
AV Area VTI: 0.76 cm2
AV Area mean vel: 0.7 cm2
AV Mean grad: 33 mmHg
AV Peak grad: 57.2 mmHg
Ao pk vel: 3.78 m/s
Area-P 1/2: 2.09 cm2
P 1/2 time: 473 msec
S' Lateral: 2.65 cm

## 2020-11-04 DIAGNOSIS — I10 Essential (primary) hypertension: Secondary | ICD-10-CM | POA: Diagnosis not present

## 2020-11-04 DIAGNOSIS — I872 Venous insufficiency (chronic) (peripheral): Secondary | ICD-10-CM | POA: Diagnosis not present

## 2020-11-04 DIAGNOSIS — L97919 Non-pressure chronic ulcer of unspecified part of right lower leg with unspecified severity: Secondary | ICD-10-CM | POA: Diagnosis not present

## 2020-11-04 DIAGNOSIS — L03115 Cellulitis of right lower limb: Secondary | ICD-10-CM | POA: Diagnosis not present

## 2020-11-04 DIAGNOSIS — I87331 Chronic venous hypertension (idiopathic) with ulcer and inflammation of right lower extremity: Secondary | ICD-10-CM | POA: Diagnosis not present

## 2020-11-11 DIAGNOSIS — I87331 Chronic venous hypertension (idiopathic) with ulcer and inflammation of right lower extremity: Secondary | ICD-10-CM | POA: Diagnosis not present

## 2020-11-11 DIAGNOSIS — L03115 Cellulitis of right lower limb: Secondary | ICD-10-CM | POA: Diagnosis not present

## 2020-11-11 DIAGNOSIS — L97919 Non-pressure chronic ulcer of unspecified part of right lower leg with unspecified severity: Secondary | ICD-10-CM | POA: Diagnosis not present

## 2020-11-11 DIAGNOSIS — I872 Venous insufficiency (chronic) (peripheral): Secondary | ICD-10-CM | POA: Diagnosis not present

## 2020-11-16 DIAGNOSIS — I87331 Chronic venous hypertension (idiopathic) with ulcer and inflammation of right lower extremity: Secondary | ICD-10-CM | POA: Diagnosis not present

## 2020-11-16 DIAGNOSIS — L03115 Cellulitis of right lower limb: Secondary | ICD-10-CM | POA: Diagnosis not present

## 2020-11-16 DIAGNOSIS — I872 Venous insufficiency (chronic) (peripheral): Secondary | ICD-10-CM | POA: Diagnosis not present

## 2020-11-16 DIAGNOSIS — L97919 Non-pressure chronic ulcer of unspecified part of right lower leg with unspecified severity: Secondary | ICD-10-CM | POA: Diagnosis not present

## 2020-11-21 DIAGNOSIS — I872 Venous insufficiency (chronic) (peripheral): Secondary | ICD-10-CM | POA: Diagnosis not present

## 2020-11-21 DIAGNOSIS — I87331 Chronic venous hypertension (idiopathic) with ulcer and inflammation of right lower extremity: Secondary | ICD-10-CM | POA: Diagnosis not present

## 2020-11-21 DIAGNOSIS — L97919 Non-pressure chronic ulcer of unspecified part of right lower leg with unspecified severity: Secondary | ICD-10-CM | POA: Diagnosis not present

## 2020-11-21 DIAGNOSIS — L03115 Cellulitis of right lower limb: Secondary | ICD-10-CM | POA: Diagnosis not present

## 2020-11-24 DIAGNOSIS — I87331 Chronic venous hypertension (idiopathic) with ulcer and inflammation of right lower extremity: Secondary | ICD-10-CM | POA: Diagnosis not present

## 2020-11-24 DIAGNOSIS — L03115 Cellulitis of right lower limb: Secondary | ICD-10-CM | POA: Diagnosis not present

## 2020-11-24 DIAGNOSIS — I872 Venous insufficiency (chronic) (peripheral): Secondary | ICD-10-CM | POA: Diagnosis not present

## 2020-11-24 DIAGNOSIS — L97919 Non-pressure chronic ulcer of unspecified part of right lower leg with unspecified severity: Secondary | ICD-10-CM | POA: Diagnosis not present

## 2020-11-28 DIAGNOSIS — I872 Venous insufficiency (chronic) (peripheral): Secondary | ICD-10-CM | POA: Diagnosis not present

## 2020-11-28 DIAGNOSIS — L03115 Cellulitis of right lower limb: Secondary | ICD-10-CM | POA: Diagnosis not present

## 2020-11-28 DIAGNOSIS — L97919 Non-pressure chronic ulcer of unspecified part of right lower leg with unspecified severity: Secondary | ICD-10-CM | POA: Diagnosis not present

## 2020-11-28 DIAGNOSIS — I87331 Chronic venous hypertension (idiopathic) with ulcer and inflammation of right lower extremity: Secondary | ICD-10-CM | POA: Diagnosis not present

## 2020-12-01 DIAGNOSIS — I872 Venous insufficiency (chronic) (peripheral): Secondary | ICD-10-CM | POA: Diagnosis not present

## 2020-12-01 DIAGNOSIS — L97919 Non-pressure chronic ulcer of unspecified part of right lower leg with unspecified severity: Secondary | ICD-10-CM | POA: Diagnosis not present

## 2020-12-01 DIAGNOSIS — L03115 Cellulitis of right lower limb: Secondary | ICD-10-CM | POA: Diagnosis not present

## 2020-12-01 DIAGNOSIS — I87331 Chronic venous hypertension (idiopathic) with ulcer and inflammation of right lower extremity: Secondary | ICD-10-CM | POA: Diagnosis not present

## 2020-12-05 DIAGNOSIS — L03115 Cellulitis of right lower limb: Secondary | ICD-10-CM | POA: Diagnosis not present

## 2020-12-05 DIAGNOSIS — I87331 Chronic venous hypertension (idiopathic) with ulcer and inflammation of right lower extremity: Secondary | ICD-10-CM | POA: Diagnosis not present

## 2020-12-05 DIAGNOSIS — L97919 Non-pressure chronic ulcer of unspecified part of right lower leg with unspecified severity: Secondary | ICD-10-CM | POA: Diagnosis not present

## 2020-12-05 DIAGNOSIS — I872 Venous insufficiency (chronic) (peripheral): Secondary | ICD-10-CM | POA: Diagnosis not present

## 2020-12-08 DIAGNOSIS — L97919 Non-pressure chronic ulcer of unspecified part of right lower leg with unspecified severity: Secondary | ICD-10-CM | POA: Diagnosis not present

## 2020-12-08 DIAGNOSIS — I87331 Chronic venous hypertension (idiopathic) with ulcer and inflammation of right lower extremity: Secondary | ICD-10-CM | POA: Diagnosis not present

## 2020-12-08 DIAGNOSIS — L03115 Cellulitis of right lower limb: Secondary | ICD-10-CM | POA: Diagnosis not present

## 2020-12-08 DIAGNOSIS — B379 Candidiasis, unspecified: Secondary | ICD-10-CM | POA: Diagnosis not present

## 2020-12-09 DIAGNOSIS — E785 Hyperlipidemia, unspecified: Secondary | ICD-10-CM | POA: Diagnosis not present

## 2020-12-09 DIAGNOSIS — E1142 Type 2 diabetes mellitus with diabetic polyneuropathy: Secondary | ICD-10-CM | POA: Diagnosis not present

## 2020-12-09 DIAGNOSIS — I6523 Occlusion and stenosis of bilateral carotid arteries: Secondary | ICD-10-CM | POA: Diagnosis not present

## 2020-12-09 DIAGNOSIS — E559 Vitamin D deficiency, unspecified: Secondary | ICD-10-CM | POA: Diagnosis not present

## 2020-12-09 DIAGNOSIS — I87331 Chronic venous hypertension (idiopathic) with ulcer and inflammation of right lower extremity: Secondary | ICD-10-CM | POA: Diagnosis not present

## 2020-12-09 DIAGNOSIS — Z794 Long term (current) use of insulin: Secondary | ICD-10-CM | POA: Diagnosis not present

## 2020-12-09 DIAGNOSIS — E039 Hypothyroidism, unspecified: Secondary | ICD-10-CM | POA: Diagnosis not present

## 2020-12-09 DIAGNOSIS — E1165 Type 2 diabetes mellitus with hyperglycemia: Secondary | ICD-10-CM | POA: Diagnosis not present

## 2020-12-09 DIAGNOSIS — I350 Nonrheumatic aortic (valve) stenosis: Secondary | ICD-10-CM | POA: Diagnosis not present

## 2020-12-09 DIAGNOSIS — L97919 Non-pressure chronic ulcer of unspecified part of right lower leg with unspecified severity: Secondary | ICD-10-CM | POA: Diagnosis not present

## 2020-12-09 DIAGNOSIS — I10 Essential (primary) hypertension: Secondary | ICD-10-CM | POA: Diagnosis not present

## 2020-12-09 DIAGNOSIS — E1159 Type 2 diabetes mellitus with other circulatory complications: Secondary | ICD-10-CM | POA: Diagnosis not present

## 2020-12-12 DIAGNOSIS — T7840XA Allergy, unspecified, initial encounter: Secondary | ICD-10-CM | POA: Diagnosis not present

## 2020-12-12 DIAGNOSIS — L97919 Non-pressure chronic ulcer of unspecified part of right lower leg with unspecified severity: Secondary | ICD-10-CM | POA: Diagnosis not present

## 2020-12-12 DIAGNOSIS — I872 Venous insufficiency (chronic) (peripheral): Secondary | ICD-10-CM | POA: Diagnosis not present

## 2020-12-12 DIAGNOSIS — R21 Rash and other nonspecific skin eruption: Secondary | ICD-10-CM | POA: Diagnosis not present

## 2020-12-12 DIAGNOSIS — B379 Candidiasis, unspecified: Secondary | ICD-10-CM | POA: Diagnosis not present

## 2020-12-12 DIAGNOSIS — I87331 Chronic venous hypertension (idiopathic) with ulcer and inflammation of right lower extremity: Secondary | ICD-10-CM | POA: Diagnosis not present

## 2020-12-15 DIAGNOSIS — I872 Venous insufficiency (chronic) (peripheral): Secondary | ICD-10-CM | POA: Diagnosis not present

## 2020-12-15 DIAGNOSIS — L97919 Non-pressure chronic ulcer of unspecified part of right lower leg with unspecified severity: Secondary | ICD-10-CM | POA: Diagnosis not present

## 2020-12-15 DIAGNOSIS — B379 Candidiasis, unspecified: Secondary | ICD-10-CM | POA: Diagnosis not present

## 2020-12-15 DIAGNOSIS — I87331 Chronic venous hypertension (idiopathic) with ulcer and inflammation of right lower extremity: Secondary | ICD-10-CM | POA: Diagnosis not present

## 2020-12-19 DIAGNOSIS — I872 Venous insufficiency (chronic) (peripheral): Secondary | ICD-10-CM | POA: Diagnosis not present

## 2020-12-19 DIAGNOSIS — L97919 Non-pressure chronic ulcer of unspecified part of right lower leg with unspecified severity: Secondary | ICD-10-CM | POA: Diagnosis not present

## 2020-12-19 DIAGNOSIS — B379 Candidiasis, unspecified: Secondary | ICD-10-CM | POA: Diagnosis not present

## 2020-12-19 DIAGNOSIS — I87331 Chronic venous hypertension (idiopathic) with ulcer and inflammation of right lower extremity: Secondary | ICD-10-CM | POA: Diagnosis not present

## 2020-12-22 DIAGNOSIS — I87331 Chronic venous hypertension (idiopathic) with ulcer and inflammation of right lower extremity: Secondary | ICD-10-CM | POA: Diagnosis not present

## 2020-12-22 DIAGNOSIS — I872 Venous insufficiency (chronic) (peripheral): Secondary | ICD-10-CM | POA: Diagnosis not present

## 2020-12-22 DIAGNOSIS — B379 Candidiasis, unspecified: Secondary | ICD-10-CM | POA: Diagnosis not present

## 2020-12-22 DIAGNOSIS — L97919 Non-pressure chronic ulcer of unspecified part of right lower leg with unspecified severity: Secondary | ICD-10-CM | POA: Diagnosis not present

## 2020-12-27 ENCOUNTER — Encounter (HOSPITAL_BASED_OUTPATIENT_CLINIC_OR_DEPARTMENT_OTHER): Payer: Medicare Other | Admitting: Internal Medicine

## 2020-12-30 DIAGNOSIS — I872 Venous insufficiency (chronic) (peripheral): Secondary | ICD-10-CM | POA: Diagnosis not present

## 2020-12-30 DIAGNOSIS — I87331 Chronic venous hypertension (idiopathic) with ulcer and inflammation of right lower extremity: Secondary | ICD-10-CM | POA: Diagnosis not present

## 2020-12-30 DIAGNOSIS — B379 Candidiasis, unspecified: Secondary | ICD-10-CM | POA: Diagnosis not present

## 2020-12-30 DIAGNOSIS — L97919 Non-pressure chronic ulcer of unspecified part of right lower leg with unspecified severity: Secondary | ICD-10-CM | POA: Diagnosis not present

## 2021-01-05 DIAGNOSIS — L97919 Non-pressure chronic ulcer of unspecified part of right lower leg with unspecified severity: Secondary | ICD-10-CM | POA: Diagnosis not present

## 2021-01-05 DIAGNOSIS — I87331 Chronic venous hypertension (idiopathic) with ulcer and inflammation of right lower extremity: Secondary | ICD-10-CM | POA: Diagnosis not present

## 2021-01-05 DIAGNOSIS — B379 Candidiasis, unspecified: Secondary | ICD-10-CM | POA: Diagnosis not present

## 2021-01-05 DIAGNOSIS — I872 Venous insufficiency (chronic) (peripheral): Secondary | ICD-10-CM | POA: Diagnosis not present

## 2021-01-10 ENCOUNTER — Encounter (HOSPITAL_BASED_OUTPATIENT_CLINIC_OR_DEPARTMENT_OTHER): Payer: Medicare Other | Admitting: Internal Medicine

## 2021-01-11 DIAGNOSIS — I87331 Chronic venous hypertension (idiopathic) with ulcer and inflammation of right lower extremity: Secondary | ICD-10-CM | POA: Diagnosis not present

## 2021-01-11 DIAGNOSIS — L97919 Non-pressure chronic ulcer of unspecified part of right lower leg with unspecified severity: Secondary | ICD-10-CM | POA: Diagnosis not present

## 2021-01-11 DIAGNOSIS — I872 Venous insufficiency (chronic) (peripheral): Secondary | ICD-10-CM | POA: Diagnosis not present

## 2021-01-11 DIAGNOSIS — B379 Candidiasis, unspecified: Secondary | ICD-10-CM | POA: Diagnosis not present

## 2021-01-16 DIAGNOSIS — I87331 Chronic venous hypertension (idiopathic) with ulcer and inflammation of right lower extremity: Secondary | ICD-10-CM | POA: Diagnosis not present

## 2021-01-16 DIAGNOSIS — I872 Venous insufficiency (chronic) (peripheral): Secondary | ICD-10-CM | POA: Diagnosis not present

## 2021-01-16 DIAGNOSIS — B379 Candidiasis, unspecified: Secondary | ICD-10-CM | POA: Diagnosis not present

## 2021-01-16 DIAGNOSIS — L97919 Non-pressure chronic ulcer of unspecified part of right lower leg with unspecified severity: Secondary | ICD-10-CM | POA: Diagnosis not present

## 2021-01-19 DIAGNOSIS — Z794 Long term (current) use of insulin: Secondary | ICD-10-CM | POA: Diagnosis not present

## 2021-01-19 DIAGNOSIS — E1159 Type 2 diabetes mellitus with other circulatory complications: Secondary | ICD-10-CM | POA: Diagnosis not present

## 2021-01-19 DIAGNOSIS — E785 Hyperlipidemia, unspecified: Secondary | ICD-10-CM | POA: Diagnosis not present

## 2021-01-19 DIAGNOSIS — I10 Essential (primary) hypertension: Secondary | ICD-10-CM | POA: Diagnosis not present

## 2021-02-06 ENCOUNTER — Ambulatory Visit: Payer: Medicare Other | Admitting: Cardiology

## 2021-03-27 ENCOUNTER — Ambulatory Visit: Payer: Medicare Other | Admitting: Cardiology

## 2021-04-10 DIAGNOSIS — E039 Hypothyroidism, unspecified: Secondary | ICD-10-CM | POA: Diagnosis not present

## 2021-04-10 DIAGNOSIS — R269 Unspecified abnormalities of gait and mobility: Secondary | ICD-10-CM | POA: Diagnosis not present

## 2021-04-10 DIAGNOSIS — I87331 Chronic venous hypertension (idiopathic) with ulcer and inflammation of right lower extremity: Secondary | ICD-10-CM | POA: Diagnosis not present

## 2021-04-10 DIAGNOSIS — I10 Essential (primary) hypertension: Secondary | ICD-10-CM | POA: Diagnosis not present

## 2021-04-10 DIAGNOSIS — E559 Vitamin D deficiency, unspecified: Secondary | ICD-10-CM | POA: Diagnosis not present

## 2021-04-10 DIAGNOSIS — E1142 Type 2 diabetes mellitus with diabetic polyneuropathy: Secondary | ICD-10-CM | POA: Diagnosis not present

## 2021-04-10 DIAGNOSIS — E1159 Type 2 diabetes mellitus with other circulatory complications: Secondary | ICD-10-CM | POA: Diagnosis not present

## 2021-04-10 DIAGNOSIS — Z794 Long term (current) use of insulin: Secondary | ICD-10-CM | POA: Diagnosis not present

## 2021-04-10 DIAGNOSIS — E785 Hyperlipidemia, unspecified: Secondary | ICD-10-CM | POA: Diagnosis not present

## 2021-04-10 DIAGNOSIS — I872 Venous insufficiency (chronic) (peripheral): Secondary | ICD-10-CM | POA: Diagnosis not present

## 2021-04-10 DIAGNOSIS — I350 Nonrheumatic aortic (valve) stenosis: Secondary | ICD-10-CM | POA: Diagnosis not present

## 2021-04-10 DIAGNOSIS — I6523 Occlusion and stenosis of bilateral carotid arteries: Secondary | ICD-10-CM | POA: Diagnosis not present

## 2021-05-24 DIAGNOSIS — Z794 Long term (current) use of insulin: Secondary | ICD-10-CM | POA: Diagnosis not present

## 2021-05-24 DIAGNOSIS — I10 Essential (primary) hypertension: Secondary | ICD-10-CM | POA: Diagnosis not present

## 2021-05-24 DIAGNOSIS — Z7189 Other specified counseling: Secondary | ICD-10-CM | POA: Diagnosis not present

## 2021-05-24 DIAGNOSIS — E1159 Type 2 diabetes mellitus with other circulatory complications: Secondary | ICD-10-CM | POA: Diagnosis not present

## 2021-05-24 DIAGNOSIS — E785 Hyperlipidemia, unspecified: Secondary | ICD-10-CM | POA: Diagnosis not present

## 2021-05-25 DIAGNOSIS — H9313 Tinnitus, bilateral: Secondary | ICD-10-CM | POA: Diagnosis not present

## 2021-05-25 DIAGNOSIS — H903 Sensorineural hearing loss, bilateral: Secondary | ICD-10-CM | POA: Diagnosis not present

## 2021-05-25 DIAGNOSIS — H838X3 Other specified diseases of inner ear, bilateral: Secondary | ICD-10-CM | POA: Diagnosis not present

## 2021-05-30 ENCOUNTER — Ambulatory Visit: Payer: Medicare Other | Admitting: Cardiology

## 2021-07-20 ENCOUNTER — Ambulatory Visit: Payer: Medicare Other | Admitting: Cardiology

## 2021-08-11 DIAGNOSIS — E785 Hyperlipidemia, unspecified: Secondary | ICD-10-CM | POA: Diagnosis not present

## 2021-08-11 DIAGNOSIS — I87331 Chronic venous hypertension (idiopathic) with ulcer and inflammation of right lower extremity: Secondary | ICD-10-CM | POA: Diagnosis not present

## 2021-08-11 DIAGNOSIS — E1159 Type 2 diabetes mellitus with other circulatory complications: Secondary | ICD-10-CM | POA: Diagnosis not present

## 2021-08-11 DIAGNOSIS — I10 Essential (primary) hypertension: Secondary | ICD-10-CM | POA: Diagnosis not present

## 2021-08-11 DIAGNOSIS — I350 Nonrheumatic aortic (valve) stenosis: Secondary | ICD-10-CM | POA: Diagnosis not present

## 2021-08-11 DIAGNOSIS — R269 Unspecified abnormalities of gait and mobility: Secondary | ICD-10-CM | POA: Diagnosis not present

## 2021-08-11 DIAGNOSIS — E1142 Type 2 diabetes mellitus with diabetic polyneuropathy: Secondary | ICD-10-CM | POA: Diagnosis not present

## 2021-08-11 DIAGNOSIS — E039 Hypothyroidism, unspecified: Secondary | ICD-10-CM | POA: Diagnosis not present

## 2021-08-11 DIAGNOSIS — Z794 Long term (current) use of insulin: Secondary | ICD-10-CM | POA: Diagnosis not present

## 2021-08-11 DIAGNOSIS — I6523 Occlusion and stenosis of bilateral carotid arteries: Secondary | ICD-10-CM | POA: Diagnosis not present

## 2021-08-14 ENCOUNTER — Ambulatory Visit: Payer: Medicare Other | Admitting: Cardiology

## 2021-09-19 ENCOUNTER — Other Ambulatory Visit: Payer: Self-pay

## 2021-09-19 ENCOUNTER — Encounter: Payer: Self-pay | Admitting: Cardiology

## 2021-09-19 ENCOUNTER — Ambulatory Visit (INDEPENDENT_AMBULATORY_CARE_PROVIDER_SITE_OTHER): Payer: Medicare Other | Admitting: Cardiology

## 2021-09-19 VITALS — BP 172/78 | HR 68 | Ht 61.0 in | Wt 166.0 lb

## 2021-09-19 DIAGNOSIS — I10 Essential (primary) hypertension: Secondary | ICD-10-CM | POA: Diagnosis not present

## 2021-09-19 DIAGNOSIS — I350 Nonrheumatic aortic (valve) stenosis: Secondary | ICD-10-CM | POA: Diagnosis not present

## 2021-09-19 NOTE — Progress Notes (Signed)
Cardiology CONSULT Note    Date:  09/19/2021   ID:  Sarah Shelton, DOB 1946/04/02, MRN [id number]  PCP:  Reynold Bowen, MD  Cardiologist:  Fransico Him, MD   Chief Complaint  Patient presents with   New Patient (Initial Visit)    Aortic stenosis    History of Present Illness:  Sarah Shelton is a 76 y.o. female who is being seen today for the evaluation of aortic stenosis at the request of Reynold Bowen, MD.  This is a 76 year old female with a history of diabetes mellitus complicated by neuropathy and polyneuropathy polyneuropathy, hypertension, hypothyroidism, hyperlipidemia, and mild chronic venous hypertension with right lower extremity edema, carotid stenosis.  She was noted to have a heart murmur back last March and underwent 2D echocardiogram showing normal LV function with EF 60 to 65% with elevated LVEDP, mild pulmonary hypertension with PASP 42 mmHg and severe calcification aortic valve with moderate to severe aortic stenosis and mild aortic insufficiency.  Mean aortic valve gradient was 33 mmHg with aortic valve V-max 3.78 m/s.  It was felt the aortic valve was likely underestimated due to small LVOT measurement.  Dimensionless index was low at 0.27.  She is now referred for further evaluation.  She is completely asymptomatic from a cardiac standpoint.  She walks about 1 mile a few times weekly and walks the dog short distance as well with no problems.  She denies any chest pain or pressure, SOB, DOE, PND, orthopnea, LE edema, dizziness, palpitations or syncope. She is compliant with her meds and is tolerating meds with no SE.     Past Medical History:  Diagnosis Date   Aortic stenosis    Candida albicans infection    Carotid stenosis    Cellulitis of right lower extremity without foot    Chronic venous hypertension (idiopathic) with ulcer and inflammation of right lower extremity (CODE) (HCC)    Diabetes mellitus without complication (HCC)    Diabetic neuropathy  (HCC)    Diabetic polyneuropathy (HCC)    Goiter    Hyperlipidemia    Hypertension    Hyperthyroidism    LVH (left ventricular hypertrophy)    Nonrheumatic aortic valve stenosis    Scratches    CAT   Sleep related leg cramps    Tendon pain    Ulcer of left lower extremity (HCC)    Ulcer of right lower extremity (HCC)    Unspecified abnormalities of gait and mobility    Unsteady gait    Vasculitis (HCC)    Vitamin D deficiency     Past Surgical History:  Procedure Laterality Date   CATARACT EXTRACTION Bilateral    HERNIA REPAIR      Current Medications: Current Meds  Medication Sig   aspirin EC 81 MG tablet Take 81 mg by mouth daily. Swallow whole.   Insulin Aspart (NOVOLOG Leroy) Inject 100 Units into the skin.   Insulin Glargine 300 UNIT/ML SOPN Inject 24 Units into the skin at bedtime.   levothyroxine (SYNTHROID) 125 MCG tablet Take 125 mcg by mouth daily before breakfast.   PRESCRIPTION MEDICATION Take 1 tablet by mouth See admin instructions. Medication for high thyroid - take 1 tablet every morning except Sundays   Vitamin D, Ergocalciferol, (DRISDOL) 1.25 MG (50000 UNIT) CAPS capsule Take 50,000 Units by mouth every 7 (seven) days.    Allergies:   Adhesive [tape]   Social History   Socioeconomic History   Marital status: Single    Spouse name: Not on  file   Number of children: Not on file   Years of education: Not on file   Highest education level: Not on file  Occupational History   Not on file  Tobacco Use   Smoking status: Never   Smokeless tobacco: Never  Substance and Sexual Activity   Alcohol use: Never   Drug use: Never   Sexual activity: Not Currently  Other Topics Concern   Not on file  Social History Narrative   Not on file   Social Determinants of Health   Financial Resource Strain: Not on file  Food Insecurity: Not on file  Transportation Needs: Not on file  Physical Activity: Not on file  Stress: Not on file  Social Connections: Not  on file     Family History:  The patient's family history includes CAD (age of onset: 65) in her mother; Diabetes in her father; Thyroid cancer in her mother.   ROS:   Please see the history of present illness.    ROS All other systems reviewed and are negative.  No flowsheet data found.     PHYSICAL EXAM:   VS:  BP (!) 172/78    Pulse 68    Ht 5\' 1"  (1.549 m)    Wt 166 lb (75.3 kg)    SpO2 99%    BMI 31.37 kg/m    GEN: Well nourished, well developed, in no acute distress  HEENT: normal  Neck: no JVD or masses.  Bilateral carotid bruits that radiate from her AV Cardiac: RRR; no rubs, or gallops,no edema.  Intact distal pulses bilaterally. 3/6 harsh late peaking SM in RUSB Respiratory:  clear to auscultation bilaterally, normal work of breathing GI: soft, nontender, nondistended, + BS MS: no deformity or atrophy  Skin: warm and dry, no rash Neuro:  Alert and Oriented x 3, Strength and sensation are intact Psych: euthymic mood, full affect  Wt Readings from Last 3 Encounters:  09/19/21 166 lb (75.3 kg)      Studies/Labs Reviewed:   EKG:  EKG is not ordered today.    Recent Labs: No results found for requested labs within last 8760 hours.   Lipid Panel No results found for: CHOL, TRIG, HDL, CHOLHDL, VLDL, LDLCALC, LDLDIRECT  Additional studies/ records that were reviewed today include:  Office visit notes from PCP    ASSESSMENT:    1. Nonrheumatic aortic valve stenosis   2. Primary hypertension      PLAN:  In order of problems listed above:  Aortic stenosis -She had a 2D echocardiogram done a year ago for evaluation of heart murmur.  This showed significant aortic stenosis.  The mean aortic valve gradient was 33 mmHg and peak velocity 3.8 m/s more consistent with moderate aortic stenosis but DI was low at 0.27.  The aortic valve area was calculated at 0.67 cm but felt likely to be underestimated given the small LVOT diameter.   -she is completely asymptomatic  but unclear how active she is although she says that she walks a mile several days a week -I will repeat a 2D echocardiogram to see if aortic stenosis has progressed -will set up modified Bruce ETT to assess functional capacity if echo is unchanged -We will refer her to structural heart clinic with Dr. Burt Knack and Dr. Angelena Form for further evaluation to determine if she would be a candidate for TAVR>>likely does not need currently unless her ETT is abnormal but would like her followed in their clinic  2.   Hypertension -  BP is poorly controlled on exam today but she says that she has white coat HTN and anxiety  -She tells me that when she sees Dr. Forde Dandy her BP is controlled -I will get a 48 hour BP monitor  Time Spent: 20 minutes total time of encounter, including 15 minutes spent in face-to-face patient care on the date of this encounter. This time includes coordination of care and counseling regarding above mentioned problem list. Remainder of non-face-to-face time involved reviewing chart documents/testing relevant to the patient encounter and documentation in the medical record. I have independently reviewed documentation from referring provider  Medication Adjustments/Labs and Tests Ordered: Current medicines are reviewed at length with the patient today.  Concerns regarding medicines are outlined above.  Medication changes, Labs and Tests ordered today are listed in the Patient Instructions below.  There are no Patient Instructions on file for this visit.   Signed, Fransico Him, MD  09/19/2021 2:30 PM    [address], [place], [place]  [address] Phone: [phone]; Fax: [phone]

## 2021-09-19 NOTE — Patient Instructions (Signed)
Medication Instructions:  Your physician recommends that you continue on your current medications as directed. Please refer to the Current Medication list given to you today.  *If you need a refill on your cardiac medications before your next appointment, please call your pharmacy*  Testing/Procedures: Your physician has requested that you have an echocardiogram. Echocardiography is a painless test that uses sound waves to create images of your heart. It provides your doctor with information about the size and shape of your heart and how well your hearts chambers and valves are working. This procedure takes approximately one hour. There are no restrictions for this procedure.  Your physician has requested that you have an exercise tolerance test (SCHEDULE ONE WEEK AFTER ECHO). For further information please visit [email or web address]. Please also follow instruction sheet, as given.  Follow-Up: At [facility], you and your health needs are our priority.  As part of our continuing mission to provide you with exceptional heart care, we have created designated Provider Care Teams.  These Care Teams include your primary Cardiologist (physician) and Advanced Practice Providers (APPs -  Physician Assistants and Nurse Practitioners) who all work together to provide you with the care you need, when you need it.  Follow up with Dr. Radford Pax as needed based off on results of testing.   Other Instructions You have been referred to our Structural Heart Team. They will be in contact with you to set up an appointment.

## 2021-09-19 NOTE — Addendum Note (Signed)
Addended by: Antonieta Iba on: 09/19/2021 02:41 PM   Modules accepted: Orders

## 2021-10-11 ENCOUNTER — Other Ambulatory Visit: Payer: Self-pay

## 2021-10-11 DIAGNOSIS — I350 Nonrheumatic aortic (valve) stenosis: Secondary | ICD-10-CM

## 2021-10-11 NOTE — Progress Notes (Signed)
Attempted to call patient on her work number [phone]. Left message for patient to call back (2/8) Called patient on home phone number and unable to leave voicemail (2/14).

## 2021-10-18 ENCOUNTER — Ambulatory Visit (HOSPITAL_COMMUNITY): Payer: Medicare Other | Attending: Cardiology

## 2021-10-18 ENCOUNTER — Other Ambulatory Visit: Payer: Self-pay

## 2021-10-18 DIAGNOSIS — I350 Nonrheumatic aortic (valve) stenosis: Secondary | ICD-10-CM | POA: Diagnosis not present

## 2021-10-18 DIAGNOSIS — I10 Essential (primary) hypertension: Secondary | ICD-10-CM | POA: Insufficient documentation

## 2021-10-18 LAB — ECHOCARDIOGRAM COMPLETE
AR max vel: 0.59 cm2
AV Area VTI: 0.55 cm2
AV Area mean vel: 0.54 cm2
AV Mean grad: 39 mmHg
AV Peak grad: 62.8 mmHg
Ao pk vel: 3.96 m/s
Area-P 1/2: 1.8 cm2
MV M vel: 6.46 m/s
MV Peak grad: 166.9 mmHg
MV VTI: 1.27 cm2
P 1/2 time: 325 msec
S' Lateral: 2.75 cm

## 2021-10-19 ENCOUNTER — Telehealth: Payer: Self-pay

## 2021-10-19 NOTE — Telephone Encounter (Signed)
The patient has been notified of the result and verbalized understanding.  All questions (if any) were answered. Antonieta Iba, RN 10/19/2021 5:14 PM  Patient has already been referred. Appointment is scheduled.

## 2021-10-19 NOTE — Telephone Encounter (Signed)
-----   Message from Sueanne Margarita, MD sent at 10/19/2021  8:55 AM EST ----- 2D echo showed normal LV function with increased stiffness of the heart muscle called diastolic dysfunction.  Left atrium is moderately enlarged.  There is mild leakiness the mitral valve.  There is mild leakiness of aortic valve and severe aortic stenosis.  Given her severe aortic stenosis please refer to structural heart clinic

## 2021-10-20 NOTE — Progress Notes (Signed)
Shared Decision Making/Informed Consent The risks [chest pain, shortness of breath, cardiac arrhythmias, dizziness, blood pressure fluctuations, myocardial infarction, stroke/transient ischemic attack, and life-threatening complications (estimated to be 1 in 10,000)], benefits (risk stratification, diagnosing coronary artery disease, treatment guidance) and alternatives of an exercise tolerance test were discussed in detail with [name] and she agrees to proceed.

## 2021-10-24 DIAGNOSIS — Z794 Long term (current) use of insulin: Secondary | ICD-10-CM | POA: Diagnosis not present

## 2021-10-24 DIAGNOSIS — E785 Hyperlipidemia, unspecified: Secondary | ICD-10-CM | POA: Diagnosis not present

## 2021-10-24 DIAGNOSIS — E1142 Type 2 diabetes mellitus with diabetic polyneuropathy: Secondary | ICD-10-CM | POA: Diagnosis not present

## 2021-10-24 DIAGNOSIS — I10 Essential (primary) hypertension: Secondary | ICD-10-CM | POA: Diagnosis not present

## 2021-10-24 DIAGNOSIS — E1159 Type 2 diabetes mellitus with other circulatory complications: Secondary | ICD-10-CM | POA: Diagnosis not present

## 2021-10-26 ENCOUNTER — Ambulatory Visit (INDEPENDENT_AMBULATORY_CARE_PROVIDER_SITE_OTHER): Payer: Medicare Other

## 2021-10-26 ENCOUNTER — Other Ambulatory Visit: Payer: Self-pay

## 2021-10-26 DIAGNOSIS — I10 Essential (primary) hypertension: Secondary | ICD-10-CM

## 2021-10-26 DIAGNOSIS — I350 Nonrheumatic aortic (valve) stenosis: Secondary | ICD-10-CM | POA: Diagnosis not present

## 2021-10-26 LAB — EXERCISE TOLERANCE TEST
Angina Index: 0
Duke Treadmill Score: -4
Estimated workload: 7
Exercise duration (min): 6 min
Exercise duration (sec): 0 s
MPHR: 144 {beats}/min
Peak HR: 116 {beats}/min
Percent HR: 80 %
RPE: 13
Rest HR: 65 {beats}/min
ST Depression (mm): 2 mm

## 2021-10-30 ENCOUNTER — Other Ambulatory Visit: Payer: Self-pay

## 2021-10-30 ENCOUNTER — Ambulatory Visit (INDEPENDENT_AMBULATORY_CARE_PROVIDER_SITE_OTHER): Payer: Medicare Other | Admitting: Internal Medicine

## 2021-10-30 ENCOUNTER — Encounter: Payer: Self-pay | Admitting: Internal Medicine

## 2021-10-30 VITALS — BP 160/60 | HR 70 | Ht 61.0 in | Wt 166.0 lb

## 2021-10-30 DIAGNOSIS — E213 Hyperparathyroidism, unspecified: Secondary | ICD-10-CM

## 2021-10-30 DIAGNOSIS — I10 Essential (primary) hypertension: Secondary | ICD-10-CM

## 2021-10-30 DIAGNOSIS — Z0000 Encounter for general adult medical examination without abnormal findings: Secondary | ICD-10-CM | POA: Diagnosis not present

## 2021-10-30 DIAGNOSIS — E538 Deficiency of other specified B group vitamins: Secondary | ICD-10-CM | POA: Diagnosis not present

## 2021-10-30 DIAGNOSIS — R7989 Other specified abnormal findings of blood chemistry: Secondary | ICD-10-CM

## 2021-10-30 DIAGNOSIS — I350 Nonrheumatic aortic (valve) stenosis: Secondary | ICD-10-CM

## 2021-10-30 NOTE — Progress Notes (Signed)
Pre Surgical Assessment: 5 M Walk Test  64M=16.48ft  5 Meter Walk Test- trial 1: 5.53 seconds 5 Meter Walk Test- trial 2: 4.88 seconds 5 Meter Walk Test- trial 3: 5.03 seconds 5 Meter Walk Test Average: 5.15 seconds

## 2021-10-30 NOTE — Patient Instructions (Signed)
Medication Instructions:  No changes *If you need a refill on your cardiac medications before your next appointment, please call your pharmacy*   Lab Work:BMET TODAY If you have labs (blood work) drawn today and your tests are completely normal, you will receive your results only by: [facility] (if you have MyChart) OR A paper copy in the mail If you have any lab test that is abnormal or we need to change your treatment, we will call you to review the results.   Testing/Procedures: TAVR CTA   Follow-Up: At [facility], you and your health needs are our priority.  As part of our continuing mission to provide you with exceptional heart care, we have created designated Provider Care Teams.  These Care Teams include your primary Cardiologist (physician) and Advanced Practice Providers (APPs -  Physician Assistants and Nurse Practitioners) who all work together to provide you with the care you need, when you need it.  We recommend signing up for the patient portal called "MyChart".  Sign up information is provided on this After Visit Summary.  MyChart is used to connect with patients for Virtual Visits (Telemedicine).  Patients are able to view lab/test results, encounter notes, upcoming appointments, etc.  Non-urgent messages can be sent to your provider as well.   To learn more about what you can do with MyChart, go to [email or web address].    Your next appointment:   3 month(s)  The format for your next appointment:   In Person  Provider:   DR [facility]    Other Instructions NONE

## 2021-10-30 NOTE — Progress Notes (Signed)
Patient ID: Sarah Shelton MRN: [id number] DOB/AGE: 77-Jan-1947 77 y.o.  Primary Care Physician:South, Annie Main, MD Primary Cardiologist: Fransico Him, MD   FOCUSED CARDIOVASCULAR PROBLEM LIST: 1.  Severe aortic stenosis with an aortic valve area 0.55 cm, dimensionless index of less than 0.27, and a mean gradient of 48 mmHg (frame 76, TTE 10/18/21); ejection fraction of 65 to 70% 2.  Type 2 diabetes 3.  Hypertension 4.  Hyperlipidemia  HISTORY OF PRESENT ILLNESS: The patient is a 76 y.o. female with the indicated medical history here for recommendations regarding aortic valvular stenosis.  Patient was recently seen by Dr. Radford Pax.  She was referred for a exercise treadmill stress test.  The patient achieved 7 METS of activity.  She was asymptomatic.  The test was stopped due to EKG changes that were noted.  The patient recently lost her job in January.  Since that time she has been relatively concerned about her financial issues and her ability to pay for medical care.  Since being laid off she continues to be quite active.  She is able to do all of her activities of daily living without any issues.  She is widowed and takes care of her household by herself.  Her son does check in on her at times.  She denies any chest pain, exertional dyspnea, presyncope, syncope, or easy fatigability.  She has had no signs or symptoms of stroke.  She has had no severe bleeding.  She has required no hospitalizations or emergency room visits for breathing issues.  She tells me that compared to a few years ago she is able to do just as much as she can today.  She does use a treadmill and walks about a mile without any issues.  Past Medical History:  Diagnosis Date   Aortic stenosis    Candida albicans infection    Carotid stenosis    Cellulitis of right lower extremity without foot    Chronic venous hypertension (idiopathic) with ulcer and inflammation of right lower extremity (CODE) (HCC)    Diabetes  mellitus without complication (HCC)    Diabetic neuropathy (HCC)    Diabetic polyneuropathy (HCC)    Goiter    Hyperlipidemia    Hypertension    Hyperthyroidism    LVH (left ventricular hypertrophy)    Nonrheumatic aortic valve stenosis    Scratches    CAT   Sleep related leg cramps    Tendon pain    Ulcer of left lower extremity (HCC)    Ulcer of right lower extremity (HCC)    Unspecified abnormalities of gait and mobility    Unsteady gait    Vasculitis (HCC)    Vitamin D deficiency     Past Surgical History:  Procedure Laterality Date   CATARACT EXTRACTION Bilateral    HERNIA REPAIR      Family History  Problem Relation Age of Onset   Thyroid cancer Mother    CAD Mother 50       65   Diabetes Father     Social History   Socioeconomic History   Marital status: Single    Spouse name: Not on file   Number of children: Not on file   Years of education: Not on file   Highest education level: Not on file  Occupational History   Not on file  Tobacco Use   Smoking status: Never   Smokeless tobacco: Never  Substance and Sexual Activity   Alcohol use: Never   Drug use:  Never   Sexual activity: Not Currently  Other Topics Concern   Not on file  Social History Narrative   Not on file   Social Determinants of Health   Financial Resource Strain: Not on file  Food Insecurity: Not on file  Transportation Needs: Not on file  Physical Activity: Not on file  Stress: Not on file  Social Connections: Not on file  Intimate Partner Violence: Not on file     Prior to Admission medications   Medication Sig Start Date End Date Taking? Authorizing Provider  amoxicillin-clavulanate (AUGMENTIN) 875-125 MG tablet Take 1 tablet by mouth 2 (two) times daily. Patient not taking: Reported on 09/19/2021 05/27/16   Elnora Morrison, MD  aspirin EC 81 MG tablet Take 81 mg by mouth daily. Swallow whole.    [provider]  ciclopirox (LOPROX) 0.77 % cream Apply topically 2  (two) times daily. Patient not taking: Reported on 09/19/2021    [provider]  fluconazole (DIFLUCAN) 150 MG tablet Take 150 mg by mouth daily. Patient not taking: Reported on 09/19/2021    [provider]  ibuprofen (ADVIL,MOTRIN) 200 MG tablet Take 400 mg by mouth every 6 (six) hours as needed (pain). Patient not taking: Reported on 09/19/2021    [provider]  Insulin Aspart (NOVOLOG [place]) Inject 100 Units into the skin.    [provider]  Insulin Glargine 300 UNIT/ML SOPN Inject 24 Units into the skin at bedtime.    [provider]  levothyroxine (SYNTHROID) 125 MCG tablet Take 125 mcg by mouth daily before breakfast.    [provider]  metFORMIN (GLUCOPHAGE) 1000 MG tablet Take 1,000 mg by mouth 2 (two) times daily with a meal. Patient not taking: Reported on 09/19/2021    [provider]  PRESCRIPTION MEDICATION Take 1 tablet by mouth See admin instructions. Medication for high thyroid - take 1 tablet every morning except Sundays    [provider]  Vitamin D, Ergocalciferol, (DRISDOL) 1.25 MG (50000 UNIT) CAPS capsule Take 50,000 Units by mouth every 7 (seven) days.    [provider]    Allergies  Allergen Reactions   Adhesive [Tape] Rash    Reaction to plastic bandages - fabric bandages ok    REVIEW OF SYSTEMS:  General: no fevers/chills/night sweats Eyes: no blurry vision, diplopia, or amaurosis ENT: no sore throat or hearing loss Resp: no cough, wheezing, or hemoptysis CV: no edema or palpitations GI: no abdominal pain, nausea, vomiting, diarrhea, or constipation GU: no dysuria, frequency, or hematuria Skin: no rash Neuro: no headache, numbness, tingling, or weakness of extremities Musculoskeletal: no joint pain or swelling Heme: no bleeding, DVT, or easy bruising Endo: no polydipsia or polyuria  BP (!) 160/60 (BP Location: Left Arm, Patient Position: Sitting, Cuff Size: Normal)    Pulse 70     Ht 5\' 1"  (1.549 m)    Wt 166 lb (75.3 kg)    BMI 31.37 kg/m   PHYSICAL EXAM: GEN:  AO x 3 in no acute distress HEENT: normal Dentition: Upper and lower dentures Neck: JVP normal. +2carotid upstrokes without bruits. No thyromegaly. Lungs: equal expansion, clear bilaterally CV: Apex is discrete and nondisplaced, RRR with 4/6 SEM Abd: soft, non-tender, non-distended; no bruit; positive bowel sounds Ext: no edema, ecchymoses, or cyanosis Vascular: 2+ femoral pulses, 2+ radial pulses       Skin: warm and dry without rash Neuro: CN II-XII grossly intact; motor and sensory grossly intact    DATA  AND STUDIES:  EKG:  NSR per ETT report (patient declines EKG today)  2D ECHO: 2/23  1. Left ventricular ejection fraction, by estimation, is 65 to 70%. The  left ventricle has normal function. The left ventricle has no regional  wall motion abnormalities. Left ventricular diastolic parameters are  consistent with Grade II diastolic  dysfunction (pseudonormalization). Elevated left atrial pressure.   2. Right ventricular systolic function is normal. The right ventricular  size is normal. There is mildly elevated pulmonary artery systolic  pressure.   3. Left atrial size was moderately dilated.   4. Mitral valve is thickened, with mildly restricted motion Mean gradient  through the valve is 3 mm Hg. Mild mitral valve regurgitation.   5. AV is thickened, calcified with restricted motion. Peak and mean  gradients through the valve are 62 and 39 mm Hg respectively. AVA (VTI) is  0.62 cm2. Dimensionless index is 0.21 consistent with severe AS. Compared  to previous echo, gradients are  increased and dimensionless index is lower (0.27 to 0.21) . Aortic valve  regurgitation is mild.   CARDIAC CATH: Pending  STS RISK CALCULATOR: Pending  NHYA CLASS: 1-2  ETT:  2/23 (Moderate risk DTS -4) Resting ECG ECG is normal. down sloping ST depression in the inferior leads was noted. T wave inversion  noted in 2 or more leads.  Stress Findings A Bruce protocol stress test was performed. Exercise capacity was normal. Patient exercised for 6 min and 0 sec. Maximum HR of 116 bpm. MPHR 80.0 %. Peak METS 7.0 .  The patient experienced no angina during the test. The test was stopped because the technologist observed ECG changes. The patient reported no symptoms during the stress test. Normal blood pressure and normal heart rate response noted during stress. Heart rate recovery was normal.  Stress ECG 2.0 mm of down sloping ST depression in the inferior and lateral leads was noted. ST depression began during stress. ST deviation persisted during recovery. ECG was interpretable and conclusive. The ECG was positive for ischemia.    ASSESSMENT AND PLAN:   Nonrheumatic aortic valve stenosis  The patient has developed severe aortic stenosis.  She is asymptomatic.  She did develop EKG changes on her exercise treadmill stress test.  The patient is inclined to hold off on any invasive procedures until she needs it.  We had a long talk about the pathophysiology and prognosis of aortic stenosis and I believe she will become symptomatic within the next few months.  She has had no angina.  I think her EKG changes are likely from left ventricular strain from her aortic stenosis.  I talked about a cardiac catheterization versus a TAVR protocol CTA which would also allow [place] to assess for obstructive coronary artery disease of the proximal portion of the vessels.  She would like to proceed with TAVR protocol CTA.  I think this is reasonable.  She has no signs or symptoms of heart failure.  I did tell her to contact our office if she has any presyncope, increasing shortness of breath, or chest pain.  Along with Drs. Chester, we will monitor her.  I have a feeling she will become symptomatic within the next few months.  I would like to see her back in 3 months.  I have personally reviewed the patients imaging data as  summarized above.  I have reviewed the natural history of aortic stenosis with the patient and family members who are present today. We have discussed  the limitations of medical therapy and the poor prognosis associated with symptomatic aortic stenosis. We have also reviewed potential treatment options, including palliative medical therapy, conventional surgical aortic valve replacement, and transcatheter aortic valve replacement. We discussed treatment options in the context of this patient's specific comorbid medical conditions.   All of the patient's questions were answered today. Will make further recommendations based on the results of studies outlined above.   Total time spent with patient today 60 minutes. This includes reviewing records, evaluating the patient and coordinating care.   Early Osmond, MD  10/30/2021 2:22 PM    [facility] [place], [place], [place]  [address] Phone: [phone]; Fax: [phone]

## 2021-10-31 ENCOUNTER — Other Ambulatory Visit: Payer: Self-pay | Admitting: Cardiology

## 2021-10-31 ENCOUNTER — Encounter: Payer: Self-pay | Admitting: Cardiology

## 2021-10-31 DIAGNOSIS — I350 Nonrheumatic aortic (valve) stenosis: Secondary | ICD-10-CM

## 2021-10-31 LAB — LIPID PANEL
Chol/HDL Ratio: 2.9 ratio (ref 0.0–4.4)
Cholesterol, Total: 182 mg/dL (ref 100–199)
HDL: 63 mg/dL (ref >39)
LDL Chol Calc (NIH): 98 mg/dL (ref 0–99)
Triglycerides: 121 mg/dL (ref 0–149)
VLDL Cholesterol Cal: 21 mg/dL (ref 5–40)

## 2021-10-31 LAB — HEPATIC FUNCTION PANEL
ALT: 10 IU/L (ref 0–32)
AST: 17 IU/L (ref 0–40)
Albumin: 4.4 g/dL (ref 3.7–4.7)
Alkaline Phosphatase: 80 IU/L (ref 44–121)
Bilirubin Total: 0.3 mg/dL (ref 0.0–1.2)
Bilirubin, Direct: 0.1 mg/dL (ref 0.00–0.40)
Total Protein: 7.5 g/dL (ref 6.0–8.5)

## 2021-10-31 LAB — BASIC METABOLIC PANEL
BUN/Creatinine Ratio: 22 (ref 12–28)
BUN: 17 mg/dL (ref 8–27)
CO2: 22 mmol/L (ref 20–29)
Calcium: 9.2 mg/dL (ref 8.7–10.3)
Chloride: 105 mmol/L (ref 96–106)
Creatinine, Ser: 0.78 mg/dL (ref 0.57–1.00)
Glucose: 87 mg/dL (ref 70–99)
Potassium: 3.9 mmol/L (ref 3.5–5.2)
Sodium: 142 mmol/L (ref 134–144)
eGFR: 79 mL/min/{1.73_m2} (ref >59)

## 2021-10-31 LAB — TSH: TSH: 0.224 u[IU]/mL — ABNORMAL LOW (ref 0.450–4.500)

## 2021-10-31 LAB — VITAMIN B12: Vitamin B-12: 912 pg/mL (ref 232–1245)

## 2021-10-31 LAB — VITAMIN D 25 HYDROXY (VIT D DEFICIENCY, FRACTURES): Vit D, 25-Hydroxy: 52.4 ng/mL (ref 30.0–100.0)

## 2021-11-03 DIAGNOSIS — D3132 Benign neoplasm of left choroid: Secondary | ICD-10-CM | POA: Diagnosis not present

## 2021-11-03 DIAGNOSIS — E113511 Type 2 diabetes mellitus with proliferative diabetic retinopathy with macular edema, right eye: Secondary | ICD-10-CM | POA: Diagnosis not present

## 2021-11-03 DIAGNOSIS — H43813 Vitreous degeneration, bilateral: Secondary | ICD-10-CM | POA: Diagnosis not present

## 2021-11-03 DIAGNOSIS — H4311 Vitreous hemorrhage, right eye: Secondary | ICD-10-CM | POA: Diagnosis not present

## 2021-11-03 DIAGNOSIS — E113492 Type 2 diabetes mellitus with severe nonproliferative diabetic retinopathy without macular edema, left eye: Secondary | ICD-10-CM | POA: Diagnosis not present

## 2021-11-07 ENCOUNTER — Ambulatory Visit (HOSPITAL_COMMUNITY): Admission: RE | Admit: 2021-11-07 | Payer: Medicare Other | Source: Ambulatory Visit

## 2021-11-17 ENCOUNTER — Other Ambulatory Visit: Payer: Self-pay | Admitting: Ophthalmology

## 2021-11-29 ENCOUNTER — Other Ambulatory Visit: Payer: Self-pay

## 2021-11-29 ENCOUNTER — Ambulatory Visit (HOSPITAL_COMMUNITY)
Admission: RE | Admit: 2021-11-29 | Discharge: 2021-11-29 | Disposition: A | Discharge status: Home / Self Care | Payer: Medicare Other | Source: Ambulatory Visit | Attending: Cardiology | Admitting: Cardiology

## 2021-11-29 DIAGNOSIS — K449 Diaphragmatic hernia without obstruction or gangrene: Secondary | ICD-10-CM | POA: Diagnosis not present

## 2021-11-29 DIAGNOSIS — I350 Nonrheumatic aortic (valve) stenosis: Secondary | ICD-10-CM | POA: Diagnosis not present

## 2021-11-29 MED ORDER — IOHEXOL 350 MG/ML SOLN
100.0000 mL | Freq: Once | INTRAVENOUS | Status: AC | PRN
  Administered 2021-11-29: 100 mL via INTRAVENOUS

## 2021-12-05 NOTE — Progress Notes (Signed)
?[facility]   ?[place] ?                                    ?Cardiology Office Note:   ? ?Date:  12/06/2021  ? ?ID:  Sarah Shelton, DOB 01/22/1946, MRN [id number] ? ?PCP:  Reynold Bowen, MD  ?[facility] Cardiologist:  Fransico Him, MD  ?[facility] Electrophysiologist:  None  ? ?Referring MD: Reynold Bowen, MD  ? ?Chief Complaint  ?Patient presents with  ? Follow-up  ?  Pre cath follow up   ? ?History of Present Illness:   ? ?Sarah Shelton is a 76 y.o. female with a hx of DM2, neuropathy, HTN, HLD, carotid stenosis, mild chronic venous hypertension with right lower extremity edema, and severe aortic stenosis.  ? ?Sarah Shelton was referred to cardiology 09/2021 for further evaluation of her aortic stenosis from her PCP. She was noted to have a heart murmur 11/2020 and underwent an echocardiogram which showed normal LV function with EF 60 to 65% with elevated LVEDP, mild pulmonary hypertension with PASP 42 mmHg and severe calcification aortic valve with moderate to severe aortic stenosis and mild aortic insufficiency. Mean aortic valve gradient was 33 mmHg with aortic valve V-max 3.78 m/s. It was felt the aortic valve was likely underestimated due to small LVOT measurement.  Dimensionless index was low at 0.27. She was noted to be asymptomatic from a cardiac standpoint however was referred to structural heart clinic for further workup. A repeat echocardiogram and exercise stress test was ordered. Echo showed LVEF at 65-70%, G2DD, mild MR, and severe aortic stenosis with peak gradient at 23mHg, mean gradient 331mg, AVA 0.62cm2, DVI 0.21. ETT 10/26/21 with EKG changes including 34m31mf downsloping ST depression in inferior and lateral leads during stress. EKG positive for ischemia.  ? ?She was then seen by Dr. ThuAli Lowe27/23 at which time she wished to pursue TAVR workup. They opted opted to go forward with CTA for further assessment of aortic valve along with  coronary vessels. Pre TAVR scans performed 11/29/21 which unfortunately had limited imaging of the coronaries however there was dense calcifications extending from the proximal to mid LAD, obscuring the lumen in the mid LAD with recommendations for LHC.  ? ?Today she is here and reports that she has been doing very well with no chest pain, palpitations, LE edema, orthopnea, SOB, dizziness, or syncope. We discussed cardiac catheterization and all questions were answered. She is having an eye procedure on 4/13 which was initially planned for 4 weeks ago at the [facility]. Unfortunately, do to her AS, [facility] prior to [facility]13 given that she will have at least 1 week down time after the eye procedure.  ? ?Past Medical History:  ?Diagnosis Date  ? Aortic stenosis   ? Candida albicans infection   ? Carotid stenosis   ? Cellulitis of right lower extremity without foot   ? Chronic venous hypertension (idiopathic) with ulcer and inflammation of right lower extremity (CODE)   ? Diabetes mellitus without complication ([place] ? Diabetic neuropathy ([place] ? Diabetic polyneuropathy ([place] ? Goiter   ? Hyperlipidemia   ? Hypertension   ? Hyperthyroidism   ? LVH (left ventricular hypertrophy)   ? Nonrheumatic aortic valve stenosis   ? Scratches   ?  CAT  ? Sleep related leg cramps   ? Tendon pain   ? Ulcer of left lower extremity ([place])   ? Ulcer of right lower extremity ([place])   ? Unspecified abnormalities of gait and mobility   ? Unsteady gait   ? Vasculitis ([place])   ? Vitamin D deficiency   ? ? ?Past Surgical History:  ?Procedure Laterality Date  ? CATARACT EXTRACTION Bilateral   ? HERNIA REPAIR    ? ? ?Current Medications: ?Current Meds  ?Medication Sig  ? aspirin EC 81 MG tablet Take 81 mg by mouth daily. Swallow whole.  ? Continuous Blood Gluc Sensor (FREESTYLE LIBRE 2 SENSOR) MISC CHANGE EVERY 14 DAYS TO MONITOR BLOOD GLUCOSE CONTINUOUSLY  ?  Continuous Blood Gluc Sensor (FREESTYLE LIBRE 2 SENSOR) MISC CHANGE EVERY 14 DAYS TO MONITOR BLOOD GLUCOSE CONTINUOUSLY  ? Cyanocobalamin (VITAMIN B12) 1000 MCG TBCR 1 tablet  ? Insulin Glargine 300 UNIT/ML SOPN Inject 24 Units into the skin at bedtime.  ? levothyroxine (SYNTHROID) 125 MCG tablet Take 125 mcg by mouth daily before breakfast.  ? NOVOLOG FLEXPEN 100 UNIT/ML FlexPen SMARTSIG:10 Unit(s) SUB-Q 3 Times Daily  ? ofloxacin (OCUFLOX) 0.3 % ophthalmic solution Place 1 drop into the right eye 4 (four) times daily.  ? prednisoLONE acetate (PRED FORTE) 1 % ophthalmic suspension Place 1 drop into the right eye 4 (four) times daily.  ? PRESCRIPTION MEDICATION Take 1 tablet by mouth See admin instructions. Medication for high thyroid - take 1 tablet every morning except Sundays  ? Vitamin D, Ergocalciferol, (DRISDOL) 1.25 MG (50000 UNIT) CAPS capsule Take 50,000 Units by mouth every 7 (seven) days.  ?  ? ?Allergies:   Adhesive [tape]  ? ?Social History  ? ?Socioeconomic History  ? Marital status: Single  ?  Spouse name: Not on file  ? Number of children: Not on file  ? Years of education: Not on file  ? Highest education level: Not on file  ?Occupational History  ? Not on file  ?Tobacco Use  ? Smoking status: Never  ? Smokeless tobacco: Never  ?Substance and Sexual Activity  ? Alcohol use: Never  ? Drug use: Never  ? Sexual activity: Not Currently  ?Other Topics Concern  ? Not on file  ?Social History Narrative  ? Not on file  ? ?Social Determinants of Health  ? ?Financial Resource Strain: Not on file  ?Food Insecurity: Not on file  ?Transportation Needs: Not on file  ?Physical Activity: Not on file  ?Stress: Not on file  ?Social Connections: Not on file  ?  ? ?Family History: ?The patient's family history includes CAD (age of onset: 52) in her mother; Diabetes in her father; Thyroid cancer in her mother. ? ?ROS:   ?Please see the history of present illness.    ?All other systems reviewed and are  negative. ? ?EKGs/Labs/Other Studies Reviewed:   ? ?The following studies were reviewed today: ? ?ETT 10/26/21: ? ?  2.0 mm of down sloping ST depression in the inferior and lateral leads was noted. ST depression began during stress. ST deviation persisted during recovery. ECG was interpretable and conclusive. The ECG was positive for ischemia. ?  Prior study not available for comparison. ?  ?ECHO: 2/23 ? 1. Left ventricular ejection fraction, by estimation, is 65 to 70%. The  ?left ventricle has normal function. The left ventricle has no regional  ?wall motion abnormalities. Left ventricular diastolic parameters are  ?consistent with Grade II diastolic  ?dysfunction (pseudonormalization). Elevated  left atrial pressure.  ? 2. Right ventricular systolic function is normal. The right ventricular  ?size is normal. There is mildly elevated pulmonary artery systolic  ?pressure.  ? 3. Left atrial size was moderately dilated.  ? 4. Mitral valve is thickened, with mildly restricted motion Mean gradient  ?through the valve is 3 mm Hg. Mild mitral valve regurgitation.  ? 5. AV is thickened, calcified with restricted motion. Peak and mean  ?gradients through the valve are 62 and 39 mm Hg respectively. AVA (VTI) is  ?0.62 cm2. Dimensionless index is 0.21 consistent with severe AS. Compared  ?to previous echo, gradients are  ?increased and dimensionless index is lower (0.27 to 0.21) . Aortic valve  ?regurgitation is mild.  ?  ?CARDIAC CATH: Pending ?  ?STS RISK CALCULATOR: Pending ?  ?NHYA CLASS: 1-2 ?  ?ETT:  2/23 (Moderate risk DTS -4) ?Resting ECG ECG is normal. down sloping ST depression in the inferior leads was noted. T wave inversion noted in 2 or more leads.  ?Stress Findings A Bruce protocol stress test was performed. Exercise capacity was normal. Patient exercised for 6 min and 0 sec. Maximum HR of 116 bpm. MPHR 80.0 %. Peak METS 7.0 .  The patient experienced no angina during the test. The test was stopped because the  technologist observed ECG changes. The patient reported no symptoms during the stress test. Normal blood pressure and normal heart rate response noted during stress. Heart rate recovery was normal.  ?Stress ECG 2.0 mm of d

## 2021-12-05 NOTE — H&P (View-Only) (Signed)
?[facility]   ?[place] ?                                    ?Cardiology Office Note:   ? ?Date:  12/06/2021  ? ?ID:  Sarah Shelton, DOB 1945-09-27, MRN [id number] ? ?PCP:  Reynold Bowen, MD  ?[facility] Cardiologist:  Fransico Him, MD  ?[facility] Electrophysiologist:  None  ? ?Referring MD: Reynold Bowen, MD  ? ?Chief Complaint  ?Patient presents with  ? Follow-up  ?  Pre cath follow up   ? ?History of Present Illness:   ? ?Sarah Shelton is a 76 y.o. female with a hx of DM2, neuropathy, HTN, HLD, carotid stenosis, mild chronic venous hypertension with right lower extremity edema, and severe aortic stenosis.  ? ?Sarah Shelton was referred to cardiology 09/2021 for further evaluation of her aortic stenosis from her PCP. She was noted to have a heart murmur 11/2020 and underwent an echocardiogram which showed normal LV function with EF 60 to 65% with elevated LVEDP, mild pulmonary hypertension with PASP 42 mmHg and severe calcification aortic valve with moderate to severe aortic stenosis and mild aortic insufficiency. Mean aortic valve gradient was 33 mmHg with aortic valve V-max 3.78 m/s. It was felt the aortic valve was likely underestimated due to small LVOT measurement.  Dimensionless index was low at 0.27. She was noted to be asymptomatic from a cardiac standpoint however was referred to structural heart clinic for further workup. A repeat echocardiogram and exercise stress test was ordered. Echo showed LVEF at 65-70%, G2DD, mild MR, and severe aortic stenosis with peak gradient at 65mHg, mean gradient 31035mg, AVA 0.62cm2, DVI 0.21. ETT 10/26/21 with EKG changes including 35m80mf downsloping ST depression in inferior and lateral leads during stress. EKG positive for ischemia.  ? ?She was then seen by Dr. ThuAli Lowe27/23 at which time she wished to pursue TAVR workup. They opted opted to go forward with CTA for further assessment of aortic valve along with  coronary vessels. Pre TAVR scans performed 11/29/21 which unfortunately had limited imaging of the coronaries however there was dense calcifications extending from the proximal to mid LAD, obscuring the lumen in the mid LAD with recommendations for LHC.  ? ?Today she is here and reports that she has been doing very well with no chest pain, palpitations, LE edema, orthopnea, SOB, dizziness, or syncope. We discussed cardiac catheterization and all questions were answered. She is having an eye procedure on 4/13 which was initially planned for 4 weeks ago at the [facility] surgical [facility]. Unfortunately, do to her AS, this was cancelled [facility] will attempt to get her cath done prior to 4/13 given that she will have at least 1 week down time after the eye procedure.  ? ?Past Medical History:  ?Diagnosis Date  ? Aortic stenosis   ? Candida albicans infection   ? Carotid stenosis   ? Cellulitis of right lower extremity without foot   ? Chronic venous hypertension (idiopathic) with ulcer and inflammation of right lower extremity (CODE)   ? Diabetes mellitus without complication ([facility] ? Diabetic neuropathy ([facility] ? Diabetic polyneuropathy ([facility] ? Goiter   ? Hyperlipidemia   ? Hypertension   ? Hyperthyroidism   ? LVH (left ventricular hypertrophy)   ? Nonrheumatic aortic valve stenosis   ? Scratches   ?  CAT  ? Sleep related leg cramps   ? Tendon pain   ? Ulcer of left lower extremity ([place])   ? Ulcer of right lower extremity ([place])   ? Unspecified abnormalities of gait and mobility   ? Unsteady gait   ? Vasculitis ([place])   ? Vitamin D deficiency   ? ? ?Past Surgical History:  ?Procedure Laterality Date  ? CATARACT EXTRACTION Bilateral   ? HERNIA REPAIR    ? ? ?Current Medications: ?Current Meds  ?Medication Sig  ? aspirin EC 81 MG tablet Take 81 mg by mouth daily. Swallow whole.  ? Continuous Blood Gluc Sensor (FREESTYLE LIBRE 2 SENSOR) MISC CHANGE EVERY 14 DAYS TO MONITOR BLOOD GLUCOSE CONTINUOUSLY  ?  Continuous Blood Gluc Sensor (FREESTYLE LIBRE 2 SENSOR) MISC CHANGE EVERY 14 DAYS TO MONITOR BLOOD GLUCOSE CONTINUOUSLY  ? Cyanocobalamin (VITAMIN B12) 1000 MCG TBCR 1 tablet  ? Insulin Glargine 300 UNIT/ML SOPN Inject 24 Units into the skin at bedtime.  ? levothyroxine (SYNTHROID) 125 MCG tablet Take 125 mcg by mouth daily before breakfast.  ? NOVOLOG FLEXPEN 100 UNIT/ML FlexPen SMARTSIG:10 Unit(s) SUB-Q 3 Times Daily  ? ofloxacin (OCUFLOX) 0.3 % ophthalmic solution Place 1 drop into the right eye 4 (four) times daily.  ? prednisoLONE acetate (PRED FORTE) 1 % ophthalmic suspension Place 1 drop into the right eye 4 (four) times daily.  ? PRESCRIPTION MEDICATION Take 1 tablet by mouth See admin instructions. Medication for high thyroid - take 1 tablet every morning except Sundays  ? Vitamin D, Ergocalciferol, (DRISDOL) 1.25 MG (50000 UNIT) CAPS capsule Take 50,000 Units by mouth every 7 (seven) days.  ?  ? ?Allergies:   Adhesive [tape]  ? ?Social History  ? ?Socioeconomic History  ? Marital status: Single  ?  Spouse name: Not on file  ? Number of children: Not on file  ? Years of education: Not on file  ? Highest education level: Not on file  ?Occupational History  ? Not on file  ?Tobacco Use  ? Smoking status: Never  ? Smokeless tobacco: Never  ?Substance and Sexual Activity  ? Alcohol use: Never  ? Drug use: Never  ? Sexual activity: Not Currently  ?Other Topics Concern  ? Not on file  ?Social History Narrative  ? Not on file  ? ?Social Determinants of Health  ? ?Financial Resource Strain: Not on file  ?Food Insecurity: Not on file  ?Transportation Needs: Not on file  ?Physical Activity: Not on file  ?Stress: Not on file  ?Social Connections: Not on file  ?  ? ?Family History: ?The patient's family history includes CAD (age of onset: 17) in her mother; Diabetes in her father; Thyroid cancer in her mother. ? ?ROS:   ?Please see the history of present illness.    ?All other systems reviewed and are  negative. ? ?EKGs/Labs/Other Studies Reviewed:   ? ?The following studies were reviewed today: ? ?ETT 10/26/21: ? ?  2.0 mm of down sloping ST depression in the inferior and lateral leads was noted. ST depression began during stress. ST deviation persisted during recovery. ECG was interpretable and conclusive. The ECG was positive for ischemia. ?  Prior study not available for comparison. ?  ?ECHO: 2/23 ? 1. Left ventricular ejection fraction, by estimation, is 65 to 70%. The  ?left ventricle has normal function. The left ventricle has no regional  ?wall motion abnormalities. Left ventricular diastolic parameters are  ?consistent with Grade II diastolic  ?dysfunction (pseudonormalization). Elevated  left atrial pressure.  ? 2. Right ventricular systolic function is normal. The right ventricular  ?size is normal. There is mildly elevated pulmonary artery systolic  ?pressure.  ? 3. Left atrial size was moderately dilated.  ? 4. Mitral valve is thickened, with mildly restricted motion Mean gradient  ?through the valve is 3 mm Hg. Mild mitral valve regurgitation.  ? 5. AV is thickened, calcified with restricted motion. Peak and mean  ?gradients through the valve are 62 and 39 mm Hg respectively. AVA (VTI) is  ?0.62 cm2. Dimensionless index is 0.21 consistent with severe AS. Compared  ?to previous echo, gradients are  ?increased and dimensionless index is lower (0.27 to 0.21) . Aortic valve  ?regurgitation is mild.  ?  ?CARDIAC CATH: Pending ?  ?STS RISK CALCULATOR: Pending ?  ?NHYA CLASS: 1-2 ?  ?ETT:  2/23 (Moderate risk DTS -4) ?Resting ECG ECG is normal. down sloping ST depression in the inferior leads was noted. T wave inversion noted in 2 or more leads.  ?Stress Findings A Bruce protocol stress test was performed. Exercise capacity was normal. Patient exercised for 6 min and 0 sec. Maximum HR of 116 bpm. MPHR 80.0 %. Peak METS 7.0 .  The patient experienced no angina during the test. The test was stopped because the  technologist observed ECG changes. The patient reported no symptoms during the stress test. Normal blood pressure and normal heart rate response noted during stress. Heart rate recovery was normal.  ?Stress ECG 2.0 mm of d

## 2021-12-06 ENCOUNTER — Ambulatory Visit (INDEPENDENT_AMBULATORY_CARE_PROVIDER_SITE_OTHER): Payer: Medicare Other | Admitting: Cardiology

## 2021-12-06 VITALS — BP 132/60 | HR 75 | Ht 61.0 in | Wt 163.0 lb

## 2021-12-06 DIAGNOSIS — I10 Essential (primary) hypertension: Secondary | ICD-10-CM

## 2021-12-06 DIAGNOSIS — Z794 Long term (current) use of insulin: Secondary | ICD-10-CM | POA: Diagnosis not present

## 2021-12-06 DIAGNOSIS — E118 Type 2 diabetes mellitus with unspecified complications: Secondary | ICD-10-CM | POA: Diagnosis not present

## 2021-12-06 DIAGNOSIS — I350 Nonrheumatic aortic (valve) stenosis: Secondary | ICD-10-CM | POA: Diagnosis not present

## 2021-12-06 LAB — BASIC METABOLIC PANEL
BUN/Creatinine Ratio: 19 (ref 12–28)
BUN: 16 mg/dL (ref 8–27)
CO2: 22 mmol/L (ref 20–29)
Calcium: 9 mg/dL (ref 8.7–10.3)
Chloride: 106 mmol/L (ref 96–106)
Creatinine, Ser: 0.84 mg/dL (ref 0.57–1.00)
Glucose: 120 mg/dL — ABNORMAL HIGH (ref 70–99)
Potassium: 4.3 mmol/L (ref 3.5–5.2)
Sodium: 141 mmol/L (ref 134–144)
eGFR: 72 mL/min/{1.73_m2} (ref >59)

## 2021-12-06 LAB — CBC
Hematocrit: 37.8 % (ref 34.0–46.6)
Hemoglobin: 12.8 g/dL (ref 11.1–15.9)
MCH: 31.8 pg (ref 26.6–33.0)
MCHC: 33.9 g/dL (ref 31.5–35.7)
MCV: 94 fL (ref 79–97)
Platelets: 220 10*3/uL (ref 150–450)
RBC: 4.02 x10E6/uL (ref 3.77–5.28)
RDW: 12.3 % (ref 11.7–15.4)
WBC: 5.8 10*3/uL (ref 3.4–10.8)

## 2021-12-06 NOTE — Patient Instructions (Signed)
Medication Instructions:  ?Your physician recommends that you continue on your current medications as directed. Please refer to the Current Medication list given to you today. ? ?*If you need a refill on your cardiac medications before your next appointment, please call your pharmacy* ? ? ?Lab Work: ?Bmp, Cbc- Today  ? ?If you have labs (blood work) drawn today and your tests are completely normal, you will receive your results only by: ?MyChart Message (if you have MyChart) OR ?A paper copy in the mail ?If you have any lab test that is abnormal or we need to change your treatment, we will call you to review the results. ? ? ?Testing/Procedures: ?Your physician has requested that you have a cardiac catheterization. Cardiac catheterization is used to diagnose and/or treat various heart conditions. Doctors may recommend this procedure for a number of different reasons. The most common reason is to evaluate chest pain. Chest pain can be a symptom of coronary artery disease (CAD), and cardiac catheterization can show whether plaque is narrowing or blocking your heart?s arteries. This procedure is also used to evaluate the valves, as well as measure the blood flow and oxygen levels in different parts of your heart. For further information please visit [email or web address]. Please follow instruction sheet, as given. ? ? ? ?Follow-Up: ?Follow up as scheduled }  ? ? ?Other Instructions ? ?[facility] OFFICE ?[address], [address] ?[place] [address] ?Dept: [phone] ?Loc: [phone] ? ?Tenleigh Byer  12/06/2021 ? ?You are scheduled for a Cardiac Catheterization on Monday, April 10 with Dr. Lenna Sciara. ? ?1. Please arrive at the Main Entrance A at [facility]: [place], [place] [address] at 8:30 AM (This time is two hours before your procedure to ensure your preparation). Free valet parking service is available.   ? ?Special note: Every effort is made to have your procedure done on time. Please understand that emergencies sometimes delay scheduled procedures. ? ?2. Diet: Do not eat solid foods after midnight.  You may have clear liquids until 5 AM upon the day of the procedure. ? ?3. Medication instructions in preparation for your procedure: ? ?Take half a dose of your insulin the night before your procedure. Do not take any the day of your procedure.  ? ?On the morning of your procedure, take Aspirin and any morning medicines NOT listed above.  You may use sips of water. ? ?5. Plan to go home the same day, you will only stay overnight if medically necessary. ?6. You MUST have a responsible adult to drive you home. ?7. An adult MUST be with you the first 24 hours after you arrive home. ?8. Bring a current list of your medications, and the last time and date medication taken. ?9. Bring ID and current insurance cards. ?10.Please wear clothes that are easy to get on and off and wear slip-on shoes. ? ?Thank you for allowing [place] to care for you! ?  -- [facility] ? ?

## 2021-12-07 ENCOUNTER — Telehealth: Payer: Self-pay | Admitting: *Deleted

## 2021-12-07 NOTE — Telephone Encounter (Signed)
Cardiac Catheterization scheduled at [facility] for: Monday December 11, 2021 10:30 AM ?Arrival time and place: [place] Entrance A at: 8:30 AM ? ? ?No solid food after midnight prior to cath, clear liquids until 5 AM day of procedure. ? ? ?Medication instructions: ?-Hold: ? Insulin-AM of procedure/1/2 Insulin HS prior to procedure ?-Usual morning medications can be taken with sips of water. ? Patient will not take aspirin the morning of the procedure-on hold for eye surgery 12/14/21. ? ?Confirmed patient has responsible adult to drive home post procedure and be with patient first 24 hours after arriving home. ? ?Patient reports no new symptoms concerning for COVID-19/no exposure to COVID-19 in the past 10 days. ? ?Reviewed procedure instructions with patient.  ? ?

## 2021-12-07 NOTE — Telephone Encounter (Signed)
Patient reports aspirin on hold for eye surgery 12/14/21. ?Patient reports after discussion at office visit 12/06/21, it was decided that she would not take aspirin the morning of the cath.  ?

## 2021-12-11 ENCOUNTER — Encounter (HOSPITAL_COMMUNITY)
Admission: RE | Disposition: A | Discharge status: Home / Self Care | Payer: Self-pay | Source: Home / Self Care | Attending: Internal Medicine

## 2021-12-11 ENCOUNTER — Other Ambulatory Visit: Payer: Self-pay

## 2021-12-11 ENCOUNTER — Encounter (HOSPITAL_COMMUNITY): Payer: Self-pay | Admitting: Internal Medicine

## 2021-12-11 ENCOUNTER — Ambulatory Visit (HOSPITAL_COMMUNITY)
Admission: RE | Admit: 2021-12-11 | Discharge: 2021-12-11 | Disposition: A | Discharge status: Home / Self Care | Payer: Medicare Other | Attending: Internal Medicine | Admitting: Internal Medicine

## 2021-12-11 DIAGNOSIS — I350 Nonrheumatic aortic (valve) stenosis: Secondary | ICD-10-CM | POA: Diagnosis not present

## 2021-12-11 DIAGNOSIS — Z794 Long term (current) use of insulin: Secondary | ICD-10-CM | POA: Insufficient documentation

## 2021-12-11 DIAGNOSIS — I352 Nonrheumatic aortic (valve) stenosis with insufficiency: Secondary | ICD-10-CM | POA: Insufficient documentation

## 2021-12-11 DIAGNOSIS — I2510 Atherosclerotic heart disease of native coronary artery without angina pectoris: Secondary | ICD-10-CM | POA: Diagnosis not present

## 2021-12-11 DIAGNOSIS — E1140 Type 2 diabetes mellitus with diabetic neuropathy, unspecified: Secondary | ICD-10-CM | POA: Diagnosis not present

## 2021-12-11 DIAGNOSIS — I10 Essential (primary) hypertension: Secondary | ICD-10-CM | POA: Diagnosis not present

## 2021-12-11 DIAGNOSIS — E785 Hyperlipidemia, unspecified: Secondary | ICD-10-CM | POA: Insufficient documentation

## 2021-12-11 HISTORY — PX: RIGHT/LEFT HEART CATH AND CORONARY ANGIOGRAPHY: CATH118266

## 2021-12-11 LAB — POCT I-STAT EG7
Acid-Base Excess: 0 mmol/L (ref 0.0–2.0)
Acid-base deficit: 1 mmol/L (ref 0.0–2.0)
Bicarbonate: 24.5 mmol/L (ref 20.0–28.0)
Bicarbonate: 25.9 mmol/L (ref 20.0–28.0)
Calcium, Ion: 1.15 mmol/L (ref 1.15–1.40)
Calcium, Ion: 1.2 mmol/L (ref 1.15–1.40)
HCT: 33 % — ABNORMAL LOW (ref 36.0–46.0)
HCT: 33 % — ABNORMAL LOW (ref 36.0–46.0)
Hemoglobin: 11.2 g/dL — ABNORMAL LOW (ref 12.0–15.0)
Hemoglobin: 11.2 g/dL — ABNORMAL LOW (ref 12.0–15.0)
O2 Saturation: 69 %
O2 Saturation: 75 %
Potassium: 3.7 mmol/L (ref 3.5–5.1)
Potassium: 3.8 mmol/L (ref 3.5–5.1)
Sodium: 141 mmol/L (ref 135–145)
Sodium: 143 mmol/L (ref 135–145)
TCO2: 26 mmol/L (ref 22–32)
TCO2: 27 mmol/L (ref 22–32)
pCO2, Ven: 43.2 mmHg — ABNORMAL LOW (ref 44–60)
pCO2, Ven: 44.5 mmHg (ref 44–60)
pH, Ven: 7.361 (ref 7.25–7.43)
pH, Ven: 7.373 (ref 7.25–7.43)
pO2, Ven: 37 mmHg (ref 32–45)
pO2, Ven: 42 mmHg (ref 32–45)

## 2021-12-11 LAB — POCT I-STAT 7, (LYTES, BLD GAS, ICA,H+H)
Acid-base deficit: 1 mmol/L (ref 0.0–2.0)
Bicarbonate: 23.7 mmol/L (ref 20.0–28.0)
Calcium, Ion: 1.16 mmol/L (ref 1.15–1.40)
HCT: 33 % — ABNORMAL LOW (ref 36.0–46.0)
Hemoglobin: 11.2 g/dL — ABNORMAL LOW (ref 12.0–15.0)
O2 Saturation: 95 %
Potassium: 3.8 mmol/L (ref 3.5–5.1)
Sodium: 141 mmol/L (ref 135–145)
TCO2: 25 mmol/L (ref 22–32)
pCO2 arterial: 39 mmHg (ref 32–48)
pH, Arterial: 7.391 (ref 7.35–7.45)
pO2, Arterial: 77 mmHg — ABNORMAL LOW (ref 83–108)

## 2021-12-11 LAB — GLUCOSE, CAPILLARY
Glucose-Capillary: 203 mg/dL — ABNORMAL HIGH (ref 70–99)
Glucose-Capillary: 159 mg/dL — ABNORMAL HIGH (ref 70–99)

## 2021-12-11 SURGERY — RIGHT/LEFT HEART CATH AND CORONARY ANGIOGRAPHY
Anesthesia: LOCAL

## 2021-12-11 MED ORDER — LABETALOL HCL 5 MG/ML IV SOLN: 10.0000 mg | INTRAVENOUS | Status: DC | PRN

## 2021-12-11 MED ORDER — SODIUM CHLORIDE 0.9% FLUSH: 3.0000 mL | Freq: Two times a day (BID) | INTRAVENOUS | Status: DC

## 2021-12-11 MED ORDER — FENTANYL CITRATE (PF) 100 MCG/2ML IJ SOLN
INTRAMUSCULAR | Status: DC | PRN
  Administered 2021-12-11: 25 ug via INTRAVENOUS

## 2021-12-11 MED ORDER — HEPARIN (PORCINE) IN NACL 1000-0.9 UT/500ML-% IV SOLN
INTRAVENOUS | Status: AC
  Filled 2021-12-11: qty 1000

## 2021-12-11 MED ORDER — ONDANSETRON HCL 4 MG/2ML IJ SOLN: 4.0000 mg | Freq: Four times a day (QID) | INTRAMUSCULAR | Status: DC | PRN

## 2021-12-11 MED ORDER — SODIUM CHLORIDE 0.9 % IV SOLN: 250.0000 mL | INTRAVENOUS | Status: DC | PRN

## 2021-12-11 MED ORDER — LIDOCAINE HCL (PF) 1 % IJ SOLN
INTRAMUSCULAR | Status: AC
  Filled 2021-12-11: qty 30

## 2021-12-11 MED ORDER — FENTANYL CITRATE (PF) 100 MCG/2ML IJ SOLN
INTRAMUSCULAR | Status: AC
  Filled 2021-12-11: qty 2

## 2021-12-11 MED ORDER — SODIUM CHLORIDE 0.9 % IV SOLN: INTRAVENOUS | Status: DC

## 2021-12-11 MED ORDER — VERAPAMIL HCL 2.5 MG/ML IV SOLN
INTRAVENOUS | Status: AC
  Filled 2021-12-11: qty 2

## 2021-12-11 MED ORDER — LIDOCAINE HCL (PF) 1 % IJ SOLN
INTRAMUSCULAR | Status: DC | PRN
  Administered 2021-12-11: 2 mL

## 2021-12-11 MED ORDER — SODIUM CHLORIDE 0.9% FLUSH: 3.0000 mL | INTRAVENOUS | Status: DC | PRN

## 2021-12-11 MED ORDER — SODIUM CHLORIDE 0.9 % WEIGHT BASED INFUSION
3.0000 mL/kg/h | INTRAVENOUS | Status: AC
  Administered 2021-12-11: 3 mL/kg/h via INTRAVENOUS

## 2021-12-11 MED ORDER — IOHEXOL 350 MG/ML SOLN
INTRAVENOUS | Status: DC | PRN
Start: 2021-12-11 — End: 2021-12-11
  Administered 2021-12-11: 40 mL

## 2021-12-11 MED ORDER — SODIUM CHLORIDE 0.9 % WEIGHT BASED INFUSION: 1.0000 mL/kg/h | INTRAVENOUS | Status: DC

## 2021-12-11 MED ORDER — VERAPAMIL HCL 2.5 MG/ML IV SOLN
INTRAVENOUS | Status: DC | PRN
  Administered 2021-12-11: 10 mL via INTRA_ARTERIAL

## 2021-12-11 MED ORDER — HEPARIN (PORCINE) IN NACL 1000-0.9 UT/500ML-% IV SOLN
INTRAVENOUS | Status: DC | PRN
  Administered 2021-12-11 (×2): 500 mL

## 2021-12-11 MED ORDER — HEPARIN SODIUM (PORCINE) 1000 UNIT/ML IJ SOLN
INTRAMUSCULAR | Status: AC
  Filled 2021-12-11: qty 10

## 2021-12-11 MED ORDER — HEPARIN SODIUM (PORCINE) 1000 UNIT/ML IJ SOLN
INTRAMUSCULAR | Status: DC | PRN
  Administered 2021-12-11: 5000 [IU] via INTRAVENOUS

## 2021-12-11 MED ORDER — HYDRALAZINE HCL 20 MG/ML IJ SOLN: 10.0000 mg | INTRAMUSCULAR | Status: DC | PRN

## 2021-12-11 MED ORDER — ACETAMINOPHEN 325 MG PO TABS: 650.0000 mg | ORAL_TABLET | ORAL | Status: DC | PRN

## 2021-12-11 MED ORDER — MIDAZOLAM HCL 2 MG/2ML IJ SOLN
INTRAMUSCULAR | Status: DC | PRN
  Administered 2021-12-11: 1 mg via INTRAVENOUS

## 2021-12-11 MED ORDER — SODIUM CHLORIDE 0.9% FLUSH
3.0000 mL | INTRAVENOUS | Status: DC | PRN
Start: 2021-12-11 — End: 2021-12-11

## 2021-12-11 MED ORDER — MIDAZOLAM HCL 2 MG/2ML IJ SOLN
INTRAMUSCULAR | Status: AC
  Filled 2021-12-11: qty 2

## 2021-12-11 SURGICAL SUPPLY — 16 items
CATH 5FR JR4 DIAGNOSTIC (CATHETERS) ×1 IMPLANT
CATH BALLN WEDGE 5F 110CM (CATHETERS) ×1 IMPLANT
CATH DIAG 6FR JR4 (CATHETERS) ×1 IMPLANT
CATH INFINITI 6F FL3.5 (CATHETERS) ×2 IMPLANT
DEVICE RAD COMP TR BAND LRG (VASCULAR PRODUCTS) ×1 IMPLANT
GLIDESHEATH SLEND SS 6F .021 (SHEATH) ×1 IMPLANT
GUIDEWIRE INQWIRE 1.5J.035X260 (WIRE) IMPLANT
GUIDEWIRE TIGER .035X300 (WIRE) ×1 IMPLANT
INQWIRE 1.5J .035X260CM (WIRE) ×2
KIT HEART LEFT (KITS) ×2 IMPLANT
PACK CARDIAC CATHETERIZATION (CUSTOM PROCEDURE TRAY) ×2 IMPLANT
SHEATH GLIDE SLENDER 4/5FR (SHEATH) ×1 IMPLANT
TRANSDUCER W/STOPCOCK (MISCELLANEOUS) ×2 IMPLANT
TUBING CIL FLEX 10 FLL-RA (TUBING) ×3 IMPLANT
WIRE ASAHI PROWATER 180CM (WIRE) ×1 IMPLANT
WIRE EMERALD 3MM-J .035X150CM (WIRE) ×1 IMPLANT

## 2021-12-11 NOTE — Interval H&P Note (Signed)
History and Physical Interval Note: ? ?12/11/2021 ?1:05 PM ? ?Sarah Shelton  has presented today for surgery, with the diagnosis of severe aortic stenosis.  The various methods of treatment have been discussed with the patient and family. After consideration of risks, benefits and other options for treatment, the patient has consented to  Procedure(s): ?RIGHT/LEFT HEART CATH AND CORONARY ANGIOGRAPHY (N/A) as a surgical intervention.  The patient's history has been reviewed, patient examined, no change in status, stable for surgery.  I have reviewed the patient's chart and labs.  Questions were answered to the patient's satisfaction.   ? ?Cath Lab Visit (complete for each Cath Lab visit) ? ?Clinical Evaluation Leading to the Procedure:  ? ?ACS: No. ? ?Non-ACS:   ? ?Anginal Classification: No Symptoms ? ?Anti-ischemic medical therapy: Minimal Therapy (1 class of medications) ? ?Non-Invasive Test Results: No non-invasive testing performed ? ?Prior CABG: No previous CABG ? ? ? ? ? ? ? ? ?[place] ? ? ?

## 2021-12-12 ENCOUNTER — Telehealth: Payer: Self-pay

## 2021-12-12 NOTE — Anesthesia Preprocedure Evaluation (Addendum)
Anesthesia Evaluation  ?Patient identified by MRN, date of birth, ID band ?Patient awake ? ? ? ?Reviewed: ?Allergy & Precautions, NPO status , Patient's Chart, lab work & pertinent test results ? ?History of Anesthesia Complications ?Negative for: history of anesthetic complications ? ?Airway ?Mallampati: I ? ?TM Distance: >3 FB ?Neck ROM: Full ? ? ? Dental ? ?(+) Edentulous Upper, Edentulous Lower, Dental Advisory Given ?  ?Pulmonary ?neg pulmonary ROS,  ?  ?Pulmonary exam normal ? ? ? ? ? ? ? Cardiovascular ?hypertension, Normal cardiovascular exam ? ? ?  ?Neuro/Psych ?negative neurological ROS ?   ? GI/Hepatic ?negative GI ROS, Neg liver ROS,   ?Endo/Other  ?diabetesHypothyroidism  ? Renal/GU ?negative Renal ROS  ? ?  ?Musculoskeletal ?negative musculoskeletal ROS ?(+)  ? Abdominal ?  ?Peds ? Hematology ?negative hematology ROS ?(+)   ?Anesthesia Other Findings ? ? Reproductive/Obstetrics ? ?  ? ? ? ? ? ? ? ? ? ? ? ? ? ?  ?  ? ? ? ? ? ? ?Anesthesia Physical ?Anesthesia Plan ? ?ASA: 4 ? ?Anesthesia Plan: MAC  ? ?Post-op Pain Management: Celebrex PO (pre-op)*  ? ?Induction:  ? ?PONV Risk Score and Plan: 3 and Ondansetron, Dexamethasone and Treatment may vary due to age or medical condition ? ?Airway Management Planned: Natural Airway and Simple Face Mask ? ?Additional Equipment:  ? ?Intra-op Plan:  ? ?Post-operative Plan:  ? ?Informed Consent: I have reviewed the patients History and Physical, chart, labs and discussed the procedure including the risks, benefits and alternatives for the proposed anesthesia with the patient or authorized representative who has indicated his/her understanding and acceptance.  ? ? ? ?Dental advisory given ? ?Plan Discussed with: Anesthesiologist and CRNA ? ?Anesthesia Plan Comments: (PAT note by Karoline Caldwell, PA-C: ?Patient follows with cardiology for history of severe aortic stenosis.  She is asymptomatic of this and has reasonable baseline functional  status, able to complete ADLs and light exercise (treadmill walking). She was noted to have a heart murmur?11/2020?and underwent?an?echocardiogram?which showed?normal LV function with EF 60 to 65% with elevated LVEDP, mild pulmonary hypertension with PASP 42 mmHg and severe calcification aortic valve with moderate to severe aortic stenosis and mild aortic insufficiency. Mean aortic valve gradient was 33 mmHg with aortic valve V-max 3.78 m/s. It was felt the aortic valve was likely underestimated due to small LVOT measurement. ?Dimensionless index was low at 0.27.?She was noted to be?asymptomatic from a cardiac standpoint?however was referred to structural heart clinic for further workup. A repeat echocardiogram and exercise stress test was ordered. Echo showed LVEF at 65-70%, G2DD, mild MR, and severe aortic stenosis with peak gradient at 57mHg, mean gradient 39mg, AVA 0.62cm2, DVI 0.21. ETT 10/26/21 with EKG changes including 66m27mf downsloping ST depression in inferior and lateral leads during stress.  She was able to achieve 6 METS with no symptoms, but did have EKG positive for ischemia.   ? ?This was discussed on follow-up with JilKathyrn DrownP on 12/06/2021.  Upcoming eye surgery was also discussed.  Per note, "Severe aortic stenosis: Pt referred to cardiology for the evaluation of AS. Most recent echocardiogram with?LVEF at 65-70%, G2DD, mild MR, and severe aortic stenosis with peak gradient at 666m108m mean gradient 39mm66mAVA 0.62cm2, DVI 0.21. ETT 10/26/21 with EKG changes including 66mm o4mownsloping ST depression in inferior and lateral leads during stress. EKG positive for ischemia.?Seen by structural heart team with [facility] however due  to poor image quality on CTA, plan will be to obtain [facility] for better assessment.?Obtain EKG, BMET, CBC today for [facility] 4/10 with Dr. Ali Lowe. She has follow up scheduled for 4/24.?She has been holding ASA for  her eye procedure. Please do not restart this after cath as this may interfere with upcoming eye surgery 4/13." ? 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 ? ?I spoke with Kathyrn Drown, NP on 12/12/21. She advised that the structural heart team has reviewed the pt's history and advised she certainly is at higher risk but her AS does not preclude having procedure under MAC. ? 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 ? ?BMP and CBC from 12/06/21 reviewed, unremarkable.  ? ?EKG 12/06/2021: NSR with PACs.  Rate 72. ? ?Right/left heart cath 12/11/2021: ?? 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 ?? ?1. 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 ?2. ?Normal cardiac output and index with mean RA pressure of 2 mmHg, mean PA pressure of 27 mmHg, and mean wedge pressure of 14 mmHg. ? ? ?ETT 10/26/21: ?? ?2.0 mm of down sloping ST depression in the inferior and lateral leads was noted. ST depression began during stress. ST deviation persisted during recovery. ECG was interpretable and conclusive. The ECG was positive for ischemia. ?? ?Prior study not available for comparison. ?? ?ECHO:?2/23 ??1. Left ventricular ejection fraction, by estimation, is 65 to 70%. The  ?left ventricle has normal function. The left ventricle has no regional  ?wall motion abnormalities. Left ventricular diastolic parameters are  ?consistent with Grade II diastolic  ?dysfunction (pseudonormalization). Elevated left atrial pressure.  ??2. Right ventricular systolic function is normal. The right ventricular  ?size is normal. There is mildly elevated pulmonary artery systolic  ?pressure.  ??3. Left atrial size was moderately dilated.  ??4. Mitral valve is thickened, with mildly restricted motion Mean gradient  ?through the valve is 3 mm Hg.  Mild mitral valve regurgitation.  ??5. AV is thickened, calcified with restricted motion. Peak and mean  ?gradients through the valve are 62 and 39 mm Hg respectively. AVA (VTI) is  ?0.62 cm2. Dimensionless index is 0.21 consistent with severe AS. Compared  ?to previous echo, gradients are  ?increased and dimensionless index is lower (0.27 to 0.21) . Aortic valve  ?regurgitation is mild.  ? ?)  ? ? ? ? ?Anesthesia Quick Evaluation ? ?

## 2021-12-12 NOTE — Telephone Encounter (Signed)
?  [facility]   ?MULTIDISCIPLINARY HEART VALVE TEAM ? ? ?Ms Sarah Shelton is currently undergoing evaluation for severe asymptomatic aortic stenosis with Dr Lenna Sciara. 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  The pt is scheduled for eye surgery (vitrectomy) on 4/13 with Dr Sherlynn Stalls at [facility]. Dr Ali Lowe would like to confirm that this surgery is not being performed under general anesthesia. In review of the case booking this is to be performed under MAC.  I contacted Dr Lynder Parents office and they have also confirmed that this case will be done under MAC, not general anesthesia.  Dr Ali Lowe is aware of this information and the pt can proceed as planned.  The pt is tentatively scheduled for cardiology follow-up on 4/24 with Dr Ali Lowe.  ?

## 2021-12-12 NOTE — Progress Notes (Signed)
Anesthesia Chart Review: ? ?Patient follows with cardiology for history of severe aortic stenosis.  She is asymptomatic of this and has reasonable baseline functional status, able to complete ADLs and light exercise (treadmill walking). She was noted to have a heart murmur 11/2020 and underwent an echocardiogram which showed normal LV function with EF 60 to 65% with elevated LVEDP, mild pulmonary hypertension with PASP 42 mmHg and severe calcification aortic valve with moderate to severe aortic stenosis and mild aortic insufficiency. Mean aortic valve gradient was 33 mmHg with aortic valve V-max 3.78 m/s. It was felt the aortic valve was likely underestimated due to small LVOT measurement.  Dimensionless index was low at 0.27. She was noted to be asymptomatic from a cardiac standpoint however was referred to structural heart clinic for further workup. A repeat echocardiogram and exercise stress test was ordered. Echo showed LVEF at 65-70%, G2DD, mild MR, and severe aortic stenosis with peak gradient at 590mHg, mean gradient 329mg, AVA 0.62cm2, DVI 0.21. ETT 10/26/21 with EKG changes including 90m63mf downsloping ST depression in inferior and lateral leads during stress.  She was able to achieve 6 METS with no symptoms, but did have EKG positive for ischemia.   ? ?This was discussed on follow-up with JilKathyrn DrownP on 12/06/2021.  Upcoming eye surgery was also discussed.  Per note, "Severe aortic stenosis: Pt referred to cardiology for the evaluation of AS. Most recent echocardiogram with LVEF at 65-70%, G2DD, mild MR, and severe aortic stenosis with peak gradient at 690m64m mean gradient 39mm68mAVA 0.62cm2, DVI 0.21. ETT 10/26/21 with EKG changes including 90mm o36mownsloping ST depression in inferior and lateral leads during stress. EKG positive for ischemia. Seen by structural heart team with [facility] however due to poor image quality on CTA, [facility]  [facility]. [facility], BMET, [facility]ukkaAli Lowehas follow up scheduled for 4/24. She has been holding ASA for her eye procedure. Please do not restart this after cath as this may interfere with upcoming eye surgery 4/13." ? 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 ? ?I spoke with Jill MKathyrn Drownn 12/12/21. She advised that the structural heart team has reviewed the pt's history and advised she certainly is at higher risk but her AS does not preclude having procedure under MAC. ? 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 ? ?BMP and CBC from 12/06/21 reviewed, unremarkable.  ? ?EKG 12/06/2021: NSR with PACs.  Rate 72. ? ?Right/left heart cath 12/11/2021: ?  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 ?  ?1.  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 ?2.  Normal cardiac output and index with mean RA pressure of 2 mmHg, mean PA pressure of 27 mmHg, and mean wedge pressure of 14 mmHg. ? ? ?ETT 10/26/21: ?  2.0 mm of down sloping ST depression in the inferior and lateral leads was noted. ST depression began during stress. ST deviation persisted during recovery. ECG was interpretable and conclusive. The ECG was positive for ischemia. ?  Prior study not available for comparison. ?  ?ECHO: 2/23 ? 1. Left ventricular ejection fraction, by estimation, is 65 to 70%. The  ?left ventricle has normal function. The left ventricle has no regional  ?  wall motion abnormalities. Left ventricular diastolic parameters are  ?consistent with Grade II diastolic  ?dysfunction (pseudonormalization). Elevated left atrial pressure.  ? 2. Right ventricular systolic function is normal. The right ventricular  ?size is normal. There is mildly elevated pulmonary artery systolic   ?pressure.  ? 3. Left atrial size was moderately dilated.  ? 4. Mitral valve is thickened, with mildly restricted motion Mean gradient  ?through the valve is 3 mm Hg. Mild mitral valve regurgitation.  ? 5. AV is thickened, calcified with restricted motion. Peak and mean  ?gradients through the valve are 62 and 39 mm Hg respectively. AVA (VTI) is  ?0.62 cm2. Dimensionless index is 0.21 consistent with severe AS. Compared  ?to previous echo, gradients are  ?increased and dimensionless index is lower (0.27 to 0.21) . Aortic valve  ?regurgitation is mild.  ? ? ? ?Karoline Caldwell, PA-C ?[facility] Short Stay Center/Anesthesiology ?Phone [phone] ?12/12/2021 10:38 AM ? ?

## 2021-12-13 ENCOUNTER — Encounter (HOSPITAL_COMMUNITY): Payer: Self-pay | Admitting: Ophthalmology

## 2021-12-13 ENCOUNTER — Other Ambulatory Visit: Payer: Self-pay

## 2021-12-13 DIAGNOSIS — E785 Hyperlipidemia, unspecified: Secondary | ICD-10-CM | POA: Diagnosis not present

## 2021-12-13 DIAGNOSIS — I350 Nonrheumatic aortic (valve) stenosis: Secondary | ICD-10-CM | POA: Diagnosis not present

## 2021-12-13 DIAGNOSIS — E039 Hypothyroidism, unspecified: Secondary | ICD-10-CM | POA: Diagnosis not present

## 2021-12-13 DIAGNOSIS — I87331 Chronic venous hypertension (idiopathic) with ulcer and inflammation of right lower extremity: Secondary | ICD-10-CM | POA: Diagnosis not present

## 2021-12-13 DIAGNOSIS — Z1339 Encounter for screening examination for other mental health and behavioral disorders: Secondary | ICD-10-CM | POA: Diagnosis not present

## 2021-12-13 DIAGNOSIS — E559 Vitamin D deficiency, unspecified: Secondary | ICD-10-CM | POA: Diagnosis not present

## 2021-12-13 DIAGNOSIS — E1142 Type 2 diabetes mellitus with diabetic polyneuropathy: Secondary | ICD-10-CM | POA: Diagnosis not present

## 2021-12-13 DIAGNOSIS — Z794 Long term (current) use of insulin: Secondary | ICD-10-CM | POA: Diagnosis not present

## 2021-12-13 DIAGNOSIS — Z1331 Encounter for screening for depression: Secondary | ICD-10-CM | POA: Diagnosis not present

## 2021-12-13 DIAGNOSIS — E1159 Type 2 diabetes mellitus with other circulatory complications: Secondary | ICD-10-CM | POA: Diagnosis not present

## 2021-12-13 DIAGNOSIS — Z0000 Encounter for general adult medical examination without abnormal findings: Secondary | ICD-10-CM | POA: Diagnosis not present

## 2021-12-13 DIAGNOSIS — I2510 Atherosclerotic heart disease of native coronary artery without angina pectoris: Secondary | ICD-10-CM | POA: Diagnosis not present

## 2021-12-13 NOTE — Progress Notes (Signed)
DUE TO COVID-19 ONLY TWO VISITORS ARE ALLOWED TO COME WITH YOU AND STAY IN THE SURGICAL WAITING ROOM ONLY DURING PRE OP AND PROCEDURE DAY OF SURGERY.  ? ?PCP - Dr Reynold Bowen ?Cardiologist - Dr Tressia Miners Turner/  ? ?CT Chest - 11/29/21 ?EKG - 12/06/21 ?Stress Test - 10/26/21 ?ECHO - 2//15/23 ?Cardiac Cath - 12/11/21 ? ?ICD Pacemaker/Loop - n/a ? ?Sleep Study -  n/a  ?CPAP - none ? ?THE NIGHT BEFORE SURGERY, take 11 units Insulin Glargine  ? ?If your blood sugar is less than 70 mg/dL, you will need to treat for low blood sugar: ?Treat a low blood sugar (less than 70 mg/dL) with ? cup of clear juice (cranberry or apple), 4 glucose tablets, OR glucose gel. ?Recheck blood sugar in 15 minutes after treatment (to make sure it is greater than 70 mg/dL). If your blood sugar is not greater than 70 mg/dL on recheck, call [phone] for further instructions. ? ?Aspirin Instructions: Last does of ASA was in March 2023.    ? ?Anesthesia review: Yes ? ?STOP now taking any Aspirin (unless otherwise instructed by your surgeon), Aleve, Naproxen, Ibuprofen, Motrin, Advil, Goody's, BC's, all herbal medications, fish oil, and all vitamins.  ? ?Coronavirus Screening ?Do you have any of the following symptoms:  ?Cough yes/no: No ?Fever (>100.33F)  yes/no: No ?Runny nose yes/no: No ?Sore throat yes/no: No ?Difficulty breathing/shortness of breath  yes/no: No ? ?Have you traveled in the last 14 days and where? yes/no: No ? ?Patient verbalized understanding of instructions that were given via phone. ?

## 2021-12-14 ENCOUNTER — Ambulatory Visit (HOSPITAL_COMMUNITY)
Admission: RE | Admit: 2021-12-14 | Discharge: 2021-12-14 | Disposition: A | Discharge status: Home / Self Care | Payer: Medicare Other | Source: Ambulatory Visit | Attending: Ophthalmology | Admitting: Ophthalmology

## 2021-12-14 ENCOUNTER — Encounter (HOSPITAL_COMMUNITY): Payer: Self-pay | Admitting: Ophthalmology

## 2021-12-14 ENCOUNTER — Encounter (HOSPITAL_COMMUNITY)
Admission: RE | Disposition: A | Discharge status: Home / Self Care | Payer: Self-pay | Source: Ambulatory Visit | Attending: Ophthalmology

## 2021-12-14 ENCOUNTER — Other Ambulatory Visit: Payer: Self-pay

## 2021-12-14 ENCOUNTER — Ambulatory Visit (HOSPITAL_BASED_OUTPATIENT_CLINIC_OR_DEPARTMENT_OTHER): Payer: Medicare Other | Admitting: Physician Assistant

## 2021-12-14 ENCOUNTER — Ambulatory Visit (HOSPITAL_COMMUNITY): Payer: Medicare Other | Admitting: Physician Assistant

## 2021-12-14 DIAGNOSIS — Z7984 Long term (current) use of oral hypoglycemic drugs: Secondary | ICD-10-CM | POA: Diagnosis not present

## 2021-12-14 DIAGNOSIS — E113591 Type 2 diabetes mellitus with proliferative diabetic retinopathy without macular edema, right eye: Secondary | ICD-10-CM | POA: Diagnosis not present

## 2021-12-14 DIAGNOSIS — I10 Essential (primary) hypertension: Secondary | ICD-10-CM | POA: Diagnosis not present

## 2021-12-14 DIAGNOSIS — Z794 Long term (current) use of insulin: Secondary | ICD-10-CM | POA: Diagnosis not present

## 2021-12-14 DIAGNOSIS — E039 Hypothyroidism, unspecified: Secondary | ICD-10-CM | POA: Diagnosis not present

## 2021-12-14 DIAGNOSIS — H4311 Vitreous hemorrhage, right eye: Secondary | ICD-10-CM | POA: Insufficient documentation

## 2021-12-14 DIAGNOSIS — E113511 Type 2 diabetes mellitus with proliferative diabetic retinopathy with macular edema, right eye: Secondary | ICD-10-CM | POA: Diagnosis not present

## 2021-12-14 DIAGNOSIS — E11319 Type 2 diabetes mellitus with unspecified diabetic retinopathy without macular edema: Secondary | ICD-10-CM | POA: Diagnosis not present

## 2021-12-14 HISTORY — PX: KENALOG INJECTION: SHX5298

## 2021-12-14 HISTORY — DX: Presence of dental prosthetic device (complete) (partial): Z97.2

## 2021-12-14 HISTORY — PX: PHOTOCOAGULATION WITH LASER: SHX6027

## 2021-12-14 HISTORY — DX: Unspecified hearing loss, bilateral: H91.93

## 2021-12-14 HISTORY — DX: Hypothyroidism, unspecified: E03.9

## 2021-12-14 HISTORY — PX: PARS PLANA VITRECTOMY: SHX2166

## 2021-12-14 LAB — GLUCOSE, CAPILLARY
Glucose-Capillary: 269 mg/dL — ABNORMAL HIGH (ref 70–99)
Glucose-Capillary: 196 mg/dL — ABNORMAL HIGH (ref 70–99)

## 2021-12-14 SURGERY — PARS PLANA VITRECTOMY WITH 25 GAUGE
Anesthesia: Monitor Anesthesia Care | Site: Eye | Laterality: Right

## 2021-12-14 MED ORDER — LIDOCAINE HCL (CARDIAC) PF 100 MG/5ML IV SOSY
PREFILLED_SYRINGE | INTRAVENOUS | Status: DC | PRN
  Administered 2021-12-14: 80 mg via INTRATRACHEAL

## 2021-12-14 MED ORDER — ORAL CARE MOUTH RINSE: 15.0000 mL | Freq: Once | OROMUCOSAL | Status: AC

## 2021-12-14 MED ORDER — SODIUM HYALURONATE 10 MG/ML IO SOLUTION
PREFILLED_SYRINGE | INTRAOCULAR | Status: AC
  Filled 2021-12-14: qty 0.85

## 2021-12-14 MED ORDER — SODIUM CHLORIDE 0.9 % IV SOLN: INTRAVENOUS | Status: DC | PRN

## 2021-12-14 MED ORDER — BEVACIZUMAB CHEMO INJECTION 1.25MG/0.05ML SYRINGE FOR KALEIDOSCOPE
1.2500 mg | Freq: Once | INTRAVITREAL | Status: AC
  Administered 2021-12-14: 1.25 mg via INTRAVITREAL
  Filled 2021-12-14 (×3): qty 0.1

## 2021-12-14 MED ORDER — BUPIVACAINE HCL (PF) 0.75 % IJ SOLN
INTRAMUSCULAR | Status: AC
  Filled 2021-12-14: qty 10

## 2021-12-14 MED ORDER — SODIUM CHLORIDE 0.9 % IV SOLN: INTRAVENOUS | Status: DC

## 2021-12-14 MED ORDER — SODIUM HYALURONATE 10 MG/ML IO SOLUTION
PREFILLED_SYRINGE | INTRAOCULAR | Status: DC | PRN
  Administered 2021-12-14: .85 mL via INTRAOCULAR

## 2021-12-14 MED ORDER — CEFAZOLIN SUBCONJUNCTIVAL INJECTION 100 MG/0.5 ML
100.0000 mg | INJECTION | SUBCONJUNCTIVAL | Status: AC
  Administered 2021-12-14: 100 mg via SUBCONJUNCTIVAL
  Filled 2021-12-14: qty 1

## 2021-12-14 MED ORDER — DEXAMETHASONE SODIUM PHOSPHATE 10 MG/ML IJ SOLN
INTRAMUSCULAR | Status: AC
  Filled 2021-12-14: qty 1

## 2021-12-14 MED ORDER — TOBRAMYCIN-DEXAMETHASONE 0.3-0.1 % OP OINT
TOPICAL_OINTMENT | OPHTHALMIC | Status: DC | PRN
  Administered 2021-12-14: 1 via OPHTHALMIC

## 2021-12-14 MED ORDER — BSS IO SOLN
INTRAOCULAR | Status: AC
  Filled 2021-12-14: qty 15

## 2021-12-14 MED ORDER — CHLORHEXIDINE GLUCONATE 0.12 % MT SOLN
15.0000 mL | Freq: Once | OROMUCOSAL | Status: AC
  Administered 2021-12-14: 15 mL via OROMUCOSAL
  Filled 2021-12-14: qty 15

## 2021-12-14 MED ORDER — LIDOCAINE HCL (PF) 2 % IJ SOLN
INTRAMUSCULAR | Status: AC
  Filled 2021-12-14: qty 10

## 2021-12-14 MED ORDER — BSS PLUS IO SOLN
INTRAOCULAR | Status: AC
  Filled 2021-12-14: qty 500

## 2021-12-14 MED ORDER — LIDOCAINE HCL 2 % IJ SOLN
INTRAMUSCULAR | Status: DC | PRN
  Administered 2021-12-14: 7 mL via RETROBULBAR

## 2021-12-14 MED ORDER — EPINEPHRINE PF 1 MG/ML IJ SOLN
INTRAMUSCULAR | Status: AC
  Filled 2021-12-14: qty 1

## 2021-12-14 MED ORDER — ATROPINE SULFATE 1 % OP SOLN
1.0000 [drp] | OPHTHALMIC | Status: DC
  Administered 2021-12-14 (×3): 1 [drp] via OPHTHALMIC
  Filled 2021-12-14: qty 5

## 2021-12-14 MED ORDER — DEXAMETHASONE SODIUM PHOSPHATE 10 MG/ML IJ SOLN
INTRAMUSCULAR | Status: DC | PRN
  Administered 2021-12-14: 10 mg

## 2021-12-14 MED ORDER — PROPOFOL 10 MG/ML IV BOLUS
INTRAVENOUS | Status: DC | PRN
  Administered 2021-12-14: 50 mg via INTRAVENOUS

## 2021-12-14 MED ORDER — MIDAZOLAM HCL 2 MG/2ML IJ SOLN
INTRAMUSCULAR | Status: AC
  Filled 2021-12-14: qty 2

## 2021-12-14 MED ORDER — EPINEPHRINE PF 1 MG/ML IJ SOLN
INTRAOCULAR | Status: DC | PRN
  Administered 2021-12-14: 500.3 mL

## 2021-12-14 MED ORDER — INSULIN ASPART 100 UNIT/ML IJ SOLN
0.0000 [IU] | INTRAMUSCULAR | Status: DC | PRN
  Administered 2021-12-14: 4 [IU] via SUBCUTANEOUS
  Filled 2021-12-14: qty 1

## 2021-12-14 MED ORDER — HYALURONIDASE HUMAN 150 UNIT/ML IJ SOLN
INTRAMUSCULAR | Status: AC
  Filled 2021-12-14: qty 1

## 2021-12-14 MED ORDER — TETRACAINE HCL 0.5 % OP SOLN
OPHTHALMIC | Status: AC
  Filled 2021-12-14: qty 4

## 2021-12-14 MED ORDER — PHENYLEPHRINE HCL 2.5 % OP SOLN
1.0000 [drp] | OPHTHALMIC | Status: AC
  Administered 2021-12-14 (×3): 1 [drp] via OPHTHALMIC
  Filled 2021-12-14: qty 2

## 2021-12-14 MED ORDER — ATROPINE SULFATE 1 % OP SOLN
OPHTHALMIC | Status: AC
  Filled 2021-12-14: qty 5

## 2021-12-14 MED ORDER — INDOCYANINE GREEN 25 MG IV SOLR
INTRAVENOUS | Status: AC
  Filled 2021-12-14: qty 10

## 2021-12-14 MED ORDER — LIDOCAINE HCL 2 % IJ SOLN
INTRAMUSCULAR | Status: AC
  Filled 2021-12-14: qty 20

## 2021-12-14 MED ORDER — TOBRAMYCIN-DEXAMETHASONE 0.3-0.1 % OP OINT
TOPICAL_OINTMENT | OPHTHALMIC | Status: AC
  Filled 2021-12-14: qty 3.5

## 2021-12-14 SURGICAL SUPPLY — 69 items
BAG COUNTER SPONGE SURGICOUNT (BAG) ×1 IMPLANT
BAG SPNG CNTER NS LX DISP (BAG)
BAND WRIST GAS GREEN (MISCELLANEOUS) IMPLANT
BLADE MVR KNIFE 20G (BLADE) IMPLANT
BNDG EYE OVAL (GAUZE/BANDAGES/DRESSINGS) ×1 IMPLANT
CABLE BIPOLOR RESECTION CORD (MISCELLANEOUS) ×1 IMPLANT
CANNULA ANT CHAM MAIN (OPHTHALMIC RELATED) IMPLANT
CANNULA ANTERIOR CHAMBER 27GA (MISCELLANEOUS) IMPLANT
CANNULA DUAL BORE 23G (CANNULA) IMPLANT
CANNULA DUALBORE 25G (CANNULA) IMPLANT
CANNULA VLV SOFT TIP 25G (OPHTHALMIC) ×1 IMPLANT
CANNULA VLV SOFT TIP 25GA (OPHTHALMIC) ×2 IMPLANT
CAUTERY EYE LOW TEMP 1300F FIN (OPHTHALMIC RELATED) IMPLANT
CLSR STERI-STRIP ANTIMIC 1/2X4 (GAUZE/BANDAGES/DRESSINGS) ×2 IMPLANT
CORD BIPOLAR FORCEPS 12FT (ELECTRODE) ×1 IMPLANT
COVER MAYO STAND STRL (DRAPES) IMPLANT
DRAPE HALF SHEET 40X57 (DRAPES) ×2 IMPLANT
DRAPE INCISE 51X51 W/FILM STRL (DRAPES) ×1 IMPLANT
DRAPE RETRACTOR (MISCELLANEOUS) ×2 IMPLANT
ERASER HMR WETFIELD 23G BP (MISCELLANEOUS) IMPLANT
FILTER BLUE MILLIPORE (MISCELLANEOUS) IMPLANT
FORCEPS ECKARDT ILM 25G SERR (OPHTHALMIC RELATED) IMPLANT
FORCEPS GRIESHABER ILM 25G A (INSTRUMENTS) IMPLANT
GAS AUTO FILL CONSTEL (OPHTHALMIC)
GAS AUTO FILL CONSTELLATION (OPHTHALMIC) IMPLANT
GAS WRIST BAND GREEN (MISCELLANEOUS)
GLOVE BIO SURGEON STRL SZ7.5 (GLOVE) ×2 IMPLANT
GOWN STRL REUS W/ TWL LRG LVL3 (GOWN DISPOSABLE) ×2 IMPLANT
GOWN STRL REUS W/TWL LRG LVL3 (GOWN DISPOSABLE) ×4
HANDLE PNEUMATIC FOR CONSTEL (OPHTHALMIC) IMPLANT
KIT BASIN OR (CUSTOM PROCEDURE TRAY) ×2 IMPLANT
KIT TURNOVER KIT B (KITS) IMPLANT
LENS BIOM SUPER VIEW SET DISP (MISCELLANEOUS) ×2 IMPLANT
MICROPICK 25G (MISCELLANEOUS)
NDL 18GX1X1/2 (RX/OR ONLY) (NEEDLE) ×1 IMPLANT
NDL 25GX 5/8IN NON SAFETY (NEEDLE) ×1 IMPLANT
NDL FILTER BLUNT 18X1 1/2 (NEEDLE) IMPLANT
NDL HYPO 25GX1X1/2 BEV (NEEDLE) IMPLANT
NDL HYPO 30X.5 LL (NEEDLE) ×1 IMPLANT
NDL RETROBULBAR 25GX1.5 (NEEDLE) ×1 IMPLANT
NEEDLE 18GX1X1/2 (RX/OR ONLY) (NEEDLE) ×2 IMPLANT
NEEDLE 25GX 5/8IN NON SAFETY (NEEDLE) IMPLANT
NEEDLE FILTER BLUNT 18X 1/2SAF (NEEDLE) ×1
NEEDLE FILTER BLUNT 18X1 1/2 (NEEDLE) ×1 IMPLANT
NEEDLE HYPO 25GX1X1/2 BEV (NEEDLE) IMPLANT
NEEDLE HYPO 30X.5 LL (NEEDLE) IMPLANT
NEEDLE RETROBULBAR 25GX1.5 (NEEDLE) ×2 IMPLANT
NS IRRIG 1000ML POUR BTL (IV SOLUTION) ×1 IMPLANT
PAD ARMBOARD 7.5X6 YLW CONV (MISCELLANEOUS) ×4 IMPLANT
PAK PIK VITRECTOMY CVS 25GA (OPHTHALMIC) ×3 IMPLANT
PENCIL BIPOLAR 25GA STR DISP (OPHTHALMIC RELATED) IMPLANT
PICK MICROPICK 25G (MISCELLANEOUS) IMPLANT
PROBE ENDO DIATHERMY 25G (MISCELLANEOUS) ×1 IMPLANT
PROBE LASER ILLUM FLEX CVD 25G (OPHTHALMIC) ×1 IMPLANT
ROLLS DENTAL (MISCELLANEOUS) IMPLANT
SCRAPER DIAMOND 25GA (OPHTHALMIC RELATED) IMPLANT
SHIELD EYE LENSE ONLY DISP (GAUZE/BANDAGES/DRESSINGS) ×1 IMPLANT
STOCKINETTE IMPERVIOUS 9X36 MD (GAUZE/BANDAGES/DRESSINGS) ×4 IMPLANT
STOPCOCK 4 WAY LG BORE MALE ST (IV SETS) IMPLANT
SUT ETHILON 8 0 TG100 8 (SUTURE) IMPLANT
SUT VICRYL 7 0 TG140 8 (SUTURE) IMPLANT
SUT VICRYL 8 0 TG140 8 (SUTURE) IMPLANT
SUT VICRYL ABS 6-0 S29 18IN (SUTURE) IMPLANT
SYR 10ML LL (SYRINGE) IMPLANT
SYR 20ML LL LF (SYRINGE) ×1 IMPLANT
SYR 5ML LL (SYRINGE) ×1 IMPLANT
SYR TB 1ML LUER SLIP (SYRINGE) ×1 IMPLANT
TUBE CONNECTING 12X1/4 (SUCTIONS) IMPLANT
WATER STERILE IRR 1000ML POUR (IV SOLUTION) ×2 IMPLANT

## 2021-12-14 NOTE — Brief Op Note (Signed)
12/14/2021 ? ?8:32 AM ? ?PATIENT:  Sarah Shelton  76 y.o. female ? ?PRE-OPERATIVE DIAGNOSIS:  Proliferative diabetic retinopathy, vitreous hemorrhage right eye ? ?POST-OPERATIVE DIAGNOSIS:  Proliferative diabetic retinopathy, vitreous hemorrhage right eye ? ?PROCEDURE:  Procedure(s): ?PARS PLANA VITRECTOMY WITH 25 GAUGE (Right) ?PHOTOCOAGULATION WITH LASER (Right) ?AVASTIN INJECTION (Right) ? ?SURGEON:  Surgeon(s) and Role: ?   Sherlynn Stalls, MD - Primary ? ?PHYSICIAN ASSISTANT:  ? ?ASSISTANTS: none  ? ?ANESTHESIA:   MAC ? ?EBL:  0 mL  ? ?BLOOD ADMINISTERED:none ? ?DRAINS: none  ? ?LOCAL MEDICATIONS USED:  BUPIVICAINE  ? ?SPECIMEN:  No Specimen ? ?DISPOSITION OF SPECIMEN:  N/A ? ?COUNTS:  YES ? ?TOURNIQUET:  * No tourniquets in log * ? ?DICTATION: .Other Dictation: Dictation Number [id number] ? ?PLAN OF CARE: Discharge to home after PACU ? ?PATIENT DISPOSITION:  PACU - hemodynamically stable. ?  ?Delay start of Pharmacological VTE agent (>24hrs) due to surgical blood loss or risk of bleeding: not applicable ? ?

## 2021-12-14 NOTE — Anesthesia Postprocedure Evaluation (Signed)
Anesthesia Post Note ? ?Patient: Sarah Shelton ? ?Procedure(s) Performed: PARS PLANA VITRECTOMY WITH 25 GAUGE (Right: Eye) ?PHOTOCOAGULATION WITH LASER (Right: Eye) ?AVASTIN INJECTION (Right: Eye) ? ?  ? ?Patient location during evaluation: PACU ?Anesthesia Type: MAC ?Level of consciousness: awake and alert ?Pain management: pain level controlled ?Vital Signs Assessment: post-procedure vital signs reviewed and stable ?Respiratory status: spontaneous breathing and respiratory function stable ?Cardiovascular status: stable ?Postop Assessment: no apparent nausea or vomiting ?Anesthetic complications: no ? ? ?No notable events documented. ? ?Last Vitals:  ?Vitals:  ? 12/14/21 0840 12/14/21 0855  ?BP: (!) 167/61 (!) 173/57  ?Pulse: 61 62  ?Resp: 19 19  ?Temp:  36.5 ?C  ?SpO2: 98% 98%  ?  ?Last Pain:  ?Vitals:  ? 12/14/21 0855  ?PainSc: 0-No pain  ? ? ?  ?  ?  ?  ?  ?  ? ?Aislyn Hayse DANIEL ? ? ? ? ?

## 2021-12-14 NOTE — Anesthesia Procedure Notes (Signed)
Procedure Name: [place] ?Date/Time: 12/14/2021 7:26 AM ?Performed by: Claris Che, CRNA ?Pre-anesthesia Checklist: Patient identified, Emergency Drugs available, Suction available, Patient being monitored and Timeout performed ?Patient Re-evaluated:Patient Re-evaluated prior to induction ?Oxygen Delivery Method: Nasal cannula ?Dental Injury: Teeth and Oropharynx as per pre-operative assessment  ? ? ? ? ?

## 2021-12-14 NOTE — H&P (Signed)
Sarah Shelton is an 76 y.o. female.   ?Chief Complaint: vision loss OD  ?HPI: Dx with PDR and VH OD ? ?Past Medical History:  ?Diagnosis Date  ? Aortic stenosis   ? Bilateral hearing loss   ? wears hearing aids  ? Candida albicans infection   ? Carotid stenosis   ? Cellulitis of right lower extremity without foot   ? Chronic venous hypertension (idiopathic) with ulcer and inflammation of right lower extremity (CODE)   ? Diabetes mellitus without complication ([place])   ? Type 2, FreeStyle Libre-sensor located on upper left arm  ? Diabetic neuropathy ([place])   ? Diabetic polyneuropathy ([place])   ? Goiter   ? Hyperlipidemia   ? Hypertension   ? pt denies this dx as of 12/13/21  ? Hyperthyroidism   ? Hypothyroidism   ? LVH (left ventricular hypertrophy)   ? Nonrheumatic aortic valve stenosis   ? Scratches   ? CAT  ? Sleep related leg cramps   ? Tendon pain   ? Ulcer of left lower extremity ([place])   ? Ulcer of right lower extremity ([place])   ? Unspecified abnormalities of gait and mobility   ? Unsteady gait   ? Vasculitis ([place])   ? Vitamin D deficiency   ? Wears dentures   ? full  ? ? ?Past Surgical History:  ?Procedure Laterality Date  ? CATARACT EXTRACTION Bilateral   ? HERNIA REPAIR    ? RIGHT/LEFT HEART CATH AND CORONARY ANGIOGRAPHY N/A 12/11/2021  ? Procedure: RIGHT/LEFT HEART CATH AND CORONARY ANGIOGRAPHY;  Surgeon: Early Osmond, MD;  Location: [facility];  Service: Cardiovascular;  Laterality: N/A;  ? ? ?Family History  ?Problem Relation Age of Onset  ? Thyroid cancer Mother   ? CAD Mother 71  ?     1972  ? Diabetes Father   ? ?Social History:  reports that she has never smoked. She has never used smokeless tobacco. She reports that she does not drink alcohol and does not use drugs. ? ?Allergies:  ?Allergies  ?Allergen Reactions  ? Adhesive [Tape] Rash  ?  Reaction to plastic bandages - fabric bandages ok ?Paper Tape is okay to use.  ? Cortisone Rash  ?  Topical cortisone  ? ? ?Medications Prior to  Admission  ?Medication Sig Dispense Refill  ? aspirin EC 81 MG tablet Take 81 mg by mouth daily. Swallow whole.    ? Cyanocobalamin (VITAMIN B12) 1000 MCG TBCR Take 1,000 mcg by mouth daily.    ? insulin glargine, 2 Unit Dial, (TOUJEO MAX SOLOSTAR) 300 UNIT/ML Solostar Pen Inject 22 Units into the skin at bedtime.    ? levothyroxine (SYNTHROID) 125 MCG tablet Take 125 mcg by mouth See admin instructions. Take every day except Sunday    ? NOVOLOG FLEXPEN 100 UNIT/ML FlexPen 9 Units 3 (three) times daily with meals.    ? Vitamin D, Ergocalciferol, (DRISDOL) 1.25 MG (50000 UNIT) CAPS capsule Take 50,000 Units by mouth every 7 (seven) days.    ? Continuous Blood Gluc Sensor (FREESTYLE LIBRE 2 SENSOR) MISC CHANGE EVERY 14 DAYS TO MONITOR BLOOD GLUCOSE CONTINUOUSLY    ? Continuous Blood Gluc Sensor (FREESTYLE LIBRE 2 SENSOR) MISC CHANGE EVERY 14 DAYS TO MONITOR BLOOD GLUCOSE CONTINUOUSLY    ? ofloxacin (OCUFLOX) 0.3 % ophthalmic solution Place 1 drop into the right eye 4 (four) times daily.    ? prednisoLONE acetate (PRED FORTE) 1 % ophthalmic suspension Place 1 drop into the  right eye 4 (four) times daily.    ? ? ?Results for orders placed or performed during the hospital encounter of 12/14/21 (from the past 48 hour(s))  ?Glucose, capillary     Status: Abnormal  ? Collection Time: 12/14/21  5:58 AM  ?Result Value Ref Range  ? Glucose-Capillary 269 (H) 70 - 99 mg/dL  ?  Comment: Glucose reference range applies only to samples taken after fasting for at least 8 hours.  ? ?No results found. ? ?Review of Systems  ?Eyes:  Positive for visual disturbance.  ?All other systems reviewed and are negative. ? ?Blood pressure (!) 182/64, pulse 64, temperature 98.1 ?F (36.7 ?C), resp. rate 17, height '5\' 1"'$  (1.549 m), weight 73.9 kg, SpO2 98 %. ?Physical Exam ?Constitutional:   ?   Appearance: She is obese.  ?Eyes:  ?   General: Lids are normal.  ?   Extraocular Movements: Extraocular movements intact.  ?   Conjunctiva/sclera:  Conjunctivae normal.  ?   Funduscopic exam: ?   Right eye: Hemorrhage present.  ?Neurological:  ?   Mental Status: She is alert.  ?  ? ?Assessment/Plan ?PDR w VH OD: PPV/EC/EL/AVASTIN OD ? ?Corliss Parish, MD ?12/14/2021, 7:18 AM ? ? ? ?

## 2021-12-14 NOTE — Transfer of Care (Signed)
Immediate Anesthesia Transfer of Care Note ? ?Patient: Sarah Shelton ? ?Procedure(s) Performed: PARS PLANA VITRECTOMY WITH 25 GAUGE (Right: Eye) ?PHOTOCOAGULATION WITH LASER (Right: Eye) ?AVASTIN INJECTION (Right: Eye) ? ?Patient Location: PACU ? ?Anesthesia Type:MAC ? ?Level of Consciousness: awake, alert , oriented and patient cooperative ? ?Airway & Oxygen Therapy: Patient Spontanous Breathing ? ?Post-op Assessment: Report given to RN, Post -op Vital signs reviewed and stable and Patient moving all extremities X 4 ? ?Post vital signs: Reviewed and stable ? ?Last Vitals:  ?Vitals Value Taken Time  ?BP 181/59 12/14/21 0825  ?Temp    ?Pulse 67 12/14/21 0826  ?Resp 20 12/14/21 0826  ?SpO2 98 % 12/14/21 0826  ?Vitals shown include unvalidated device data. ? ?Last Pain:  ?Vitals:  ? 12/14/21 0624  ?PainSc: 0-No pain  ?   ? ?  ? ?Complications: No notable events documented. ?

## 2021-12-15 ENCOUNTER — Encounter (HOSPITAL_COMMUNITY): Payer: Self-pay | Admitting: Ophthalmology

## 2021-12-15 DIAGNOSIS — H31091 Other chorioretinal scars, right eye: Secondary | ICD-10-CM | POA: Diagnosis not present

## 2021-12-15 NOTE — Op Note (Signed)
NAME: Sarah Shelton, Sarah Shelton MEDICAL RECORD NO: [id number] ACCOUNT NO: [id number] DATE OF BIRTH: [birth year] FACILITY: [facility] LOCATION: [facility] PHYSICIAN: Maleak Brazzel B. Baird Cancer, MD  Operative Report   DATE OF PROCEDURE: 12/14/2021  SURGEON:  Corene Cornea B. Baird Cancer, MD  ANESTHESIA:  Local MAC.   PREOPERATIVE DIAGNOSIS:  Diabetic retinopathy with vitreous hemorrhage of the right eye.  POSTOPERATIVE DIAGNOSIS:  Diabetic retinopathy with vitreous hemorrhage of the right eye.  OPERATION:  Pars plana vitrectomy with endolaser of the right eye and intravitreal injection of Avastin right eye.  COMPLICATIONS:  None.  FINDINGS:  There was a moderately dense Vitreous hemorrhage and proliferative diabetic retinopathy.  DESCRIPTION OF PROCEDURE:  The patient was identified in the preoperative holding area, a signed consent form was placed on the chart.  The right eye was marked and then she was taken to the operating room where she was sedated by the anesthesia team for  surgery.  A timeout was performed to confirm the plan and location of surgery.  The right eye was anesthetized using a retrobulbar block at that time using a retrobulbar needle.  Approximately 6 mL of a 1:1 mixture of 0.75% bupivacaine and 1% lidocaine  and 150 units of Hylenex was injected into the retroorbital space using the retrobulbar needle without difficulty.  The needle was removed.  Excellent akinesia and anesthesia was obtained.  The right eye was then prepped and draped in the usual sterile  fashion for ocular surgery.  A Lieberman speculum was then placed between the right upper and lower eyelids for exposure.  25-gauge trocars were used to introduce transconjunctival cannulas in the superior temporal, superior nasal and inferior temporal  quadrants.  The trocar was removed leaving the cannula in place.  The eye then underwent a core vitrectomy with a 25-gauge vitrector and light pipe.  Moderate to dense vitreous hemorrhage was  encountered and removed.  The vitreous was trimmed out to the  periphery.  A posterior vitreous detachment was already present.  Once this was obtained, the eye was treated with endolaser photocoagulation, which was applied with the endolaser probe.  This was performed without difficulty and then a full panretinal  pattern was placed in the peripheral retina without difficulty.  The laser was removed.  The eye was then inspected with scleral depression.  No retinal tear or detachment was seen by careful scleral depressed exam. Next, 1.25 mg of Avastin was injected  through one of the existing cannulas using a 25-gauge needle.  The needle was removed.  At that point in time, the cannulas were removed from the sclerotomies.  The sclerotomies were inspected and found to be watertight.  The eye was then treated with  subconjunctival injections of 15 mg of Ancef and 1 mg of dexamethasone.  The eye was treated topically with 1 drop of 1% atropine and TobraDex ointment.  The speculum was removed.  The eyelids were cleaned and closed.  They were then patched and  shielded.  The patient was then taken to recovery in excellent condition having tolerated the procedure very well.   VAI D: 12/14/2021 8:37:52 am T: 12/15/2021 1:15:00 am  JOB: [id number]/ [id number]

## 2021-12-22 DIAGNOSIS — H31091 Other chorioretinal scars, right eye: Secondary | ICD-10-CM | POA: Diagnosis not present

## 2021-12-22 DIAGNOSIS — E113593 Type 2 diabetes mellitus with proliferative diabetic retinopathy without macular edema, bilateral: Secondary | ICD-10-CM | POA: Diagnosis not present

## 2021-12-23 NOTE — Progress Notes (Signed)
? ? ?Patient ID: ?Sarah Shelton ?MRN: [id number] ?DOB/AGE: May 20, 1946 76 y.o. ? ?Primary Care Physician:South, Annie Main, MD ?Primary Cardiologist: Fransico Him, MD ?  ?  ?FOCUSED CARDIOVASCULAR PROBLEM LIST: ?1.  Severe aortic stenosis with an aortic valve area 0.55 cm?, dimensionless index of less than 0.27, and a mean gradient of 48 mmHg (frame 76, TTE 10/18/21); ejection fraction of 65 to 70%; no conduction abnormalities ?2.  Type 2 diabetes ?3.  Hypertension ?4.  Hyperlipidemia ? ?HISTORY OF PRESENT ILLNESS: ?The patient is a 76 y.o. female with the indicated medical history here for 91-monthfollow-up.   ? ?January 2023: When I saw her last she was referred for cardiac catheterization after stress test demonstrated some EKG changes.  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  She was asymptomatic from a dyspnea or anginal standpoint so no further interventions were pursued at that time. ? ?Today: The patient continues to do very well.  She denies any exertional dyspnea, exertional angina, presyncope or syncope.  She is doing all of her activities of daily living without any limitations.  She denies any fatigue.  She has required no unplanned hospitalizations or emergency room visits.  She recently had an outpatient eye procedure done.  She is otherwise well and without complaints. ? ?Past Medical History:  ?Diagnosis Date  ? Aortic stenosis   ? Bilateral hearing loss   ? wears hearing aids  ? Candida albicans infection   ? Carotid stenosis   ? Cellulitis of right lower extremity without foot   ? Chronic venous hypertension (idiopathic) with ulcer and inflammation of right lower extremity (CODE)   ? Diabetes mellitus without complication ([place]   ? Type 2, FreeStyle Libre-sensor located on upper left arm  ? Diabetic neuropathy ([facility]   ? Diabetic polyneuropathy ([facility]   ? Goiter   ? Hyperlipidemia   ? Hypertension   ? pt denies this dx as of 12/13/21  ? Hyperthyroidism    ? Hypothyroidism   ? LVH (left ventricular hypertrophy)   ? Nonrheumatic aortic valve stenosis   ? Scratches   ? CAT  ? Sleep related leg cramps   ? Tendon pain   ? Ulcer of left lower extremity ([facility]   ? Ulcer of right lower extremity ([facility] [place]   ? Unspecified abnormalities of gait and mobility   ? Unsteady gait   ? Vasculitis ([facility]   ? Vitamin D deficiency   ? Wears dentures   ? full  ?  ?Past Surgical History:  ?Procedure Laterality Date  ? CATARACT EXTRACTION Bilateral   ? HERNIA REPAIR    ? KENALOG INJECTION Right 12/14/2021  ? Procedure: AVASTIN INJECTION;  Surgeon: SSherlynn Stalls MD;  [facility]  Service: Ophthalmology;  Laterality: Right;  ? PARS PLANA VITRECTOMY Right 12/14/2021  ? Procedure: PARS PLANA VITRECTOMY WITH 25 GAUGE;  Surgeon: SSherlynn Stalls MD;  [facility]  Service: Ophthalmology;  Laterality: Right;  ? PHOTOCOAGULATION WITH LASER Right 12/14/2021  ? Procedure: PHOTOCOAGULATION WITH LASER;  Surgeon: SSherlynn Stalls MD;  [place] [facility]  Service: Ophthalmology;  Laterality: Right;  ? RIGHT/LEFT HEART CATH AND CORONARY ANGIOGRAPHY N/A 12/11/2021  ? Procedure: RIGHT/LEFT HEART CATH AND CORONARY ANGIOGRAPHY;  Surgeon: TEarly Osmond MD;  [facility];  Service: Cardiovascular;  Laterality: N/A;  ?  ?Family History  ?Problem Relation Age of Onset  ? Thyroid cancer Mother   ? CAD Mother 577 ?  1972  ? Diabetes Father   ?  ?Social History  ? ?Socioeconomic History  ? Marital status: Single  ?  Spouse name: Not on file  ? Number of children: Not on file  ? Years of education: Not on file  ? Highest education level: Not on file  ?Occupational History  ? Not on file  ?Tobacco Use  ? Smoking status: Never  ? Smokeless tobacco: Never  ?Vaping Use  ? Vaping Use: Never used  ?Substance and Sexual Activity  ? Alcohol use: Never  ? Drug use: Never  ? Sexual activity: Not Currently  ?  Birth control/protection: Post-menopausal  ?Other Topics Concern  ? Not on file  ?Social History  Narrative  ? Not on file  ? ?Social Determinants of Health  ? ?Financial Resource Strain: Not on file  ?Food Insecurity: Not on file  ?Transportation Needs: Not on file  ?Physical Activity: Not on file  ?Stress: Not on file  ?Social Connections: Not on file  ?Intimate Partner Violence: Not on file  ?  ? ?Prior to Admission medications   ?Medication Sig Start Date End Date Taking? Authorizing Provider  ?aspirin EC 81 MG tablet Take 81 mg by mouth daily. Swallow whole.    [provider]  ?Continuous Blood Gluc Sensor (FREESTYLE LIBRE 2 SENSOR) MISC CHANGE EVERY 14 DAYS TO MONITOR BLOOD GLUCOSE CONTINUOUSLY 11/23/21   [provider]  ?Continuous Blood Gluc Sensor (FREESTYLE LIBRE 2 SENSOR) MISC CHANGE EVERY 14 DAYS TO MONITOR BLOOD GLUCOSE CONTINUOUSLY    [provider]  ?Cyanocobalamin (VITAMIN B12) 1000 MCG TBCR Take 1,000 mcg by mouth daily.    [provider]  ?insulin glargine, 2 Unit Dial, (TOUJEO MAX SOLOSTAR) 300 UNIT/ML Solostar Pen Inject 22 Units into the skin at bedtime.    [provider]  ?levothyroxine (SYNTHROID) 125 MCG tablet Take 125 mcg by mouth See admin instructions. Take every day except Sunday    [provider]  ?NOVOLOG FLEXPEN 100 UNIT/ML FlexPen 9 Units 3 (three) times daily with meals. 11/23/21   [provider]  ?ofloxacin (OCUFLOX) 0.3 % ophthalmic solution Place 1 drop into the right eye 4 (four) times daily. 11/03/21   [provider]  ?prednisoLONE acetate (PRED FORTE) 1 % ophthalmic suspension Place 1 drop into the right eye 4 (four) times daily. 11/03/21   [provider]  ?Vitamin D, Ergocalciferol, (DRISDOL) 1.25 MG (50000 UNIT) CAPS capsule Take 50,000 Units by mouth every 7 (seven) days.    [provider]  ? ? ?Allergies  ?Allergen Reactions  ? Adhesive [Tape] Rash  ?  Reaction to plastic bandages - fabric bandages ok ?Paper Tape is okay to use.  ? Cortisone Rash  ?  Topical cortisone   ? ? ?REVIEW OF SYSTEMS:  ?General: no fevers/chills/night sweats ?Eyes: no blurry vision, diplopia, or amaurosis ?ENT: no sore throat or hearing loss ?Resp: no cough, wheezing, or hemoptysis ?CV: no edema or palpitations ?GI: no abdominal pain, nausea, vomiting, diarrhea, or constipation ?GU: no dysuria, frequency, or hematuria ?Skin: no rash ?Neuro: no headache, numbness, tingling, or weakness of extremities ?Musculoskeletal: no joint pain or swelling ?Heme: no bleeding, DVT, or easy bruising ?Endo: no polydipsia or polyuria ? ?BP (!) 146/60   Pulse 69   Ht '5\' 1"'$  (1.549 m)   Wt 163 lb (73.9 kg)   SpO2 99%   BMI 30.80 kg/m?  ? ?PHYSICAL EXAM: ?GEN:  AO x 3 in no acute distress ?HEENT: normal ?  Dentition: Normal ?Neck: JVP normal. +1 carotid upstrokes with radiation of aortic stenosis murmur. No thyromegaly. ?Lungs: equal expansion, clear bilaterally ?CV: Apex is discrete and nondisplaced, RRR with 3/6 SEM late peaking ?Abd: soft, non-tender, non-distended; no bruit; positive bowel sounds ?Ext: no edema, ecchymoses, or cyanosis ?Vascular: 2+ femoral pulses, 2+ radial pulses       ?Skin: warm and dry without rash ?Neuro: CN II-XII grossly intact; motor and sensory grossly intact ? ? ? ?DATA AND STUDIES: ? ?  ?EKG:  NSR per ETT report (patient declines EKG today) ?  ?2D ECHO: 2/23 ? 1. Left ventricular ejection fraction, by estimation, is 65 to 70%. The  ?left ventricle has normal function. The left ventricle has no regional  ?wall motion abnormalities. Left ventricular diastolic parameters are  ?consistent with Grade II diastolic  ?dysfunction (pseudonormalization). Elevated left atrial pressure.  ? 2. Right ventricular systolic function is normal. The right ventricular  ?size is normal. There is mildly elevated pulmonary artery systolic  ?pressure.  ? 3. Left atrial size was moderately dilated.  ? 4. Mitral valve is thickened, with mildly restricted motion Mean gradient  ?through the valve is 3 mm Hg. Mild  mitral valve regurgitation.  ? 5. AV is thickened, calcified with restricted motion. Peak and mean  ?gradients through the valve are 62 and 39 mm Hg respectively. AVA (VTI) is  ?0.62 cm2. Dimensionless index is 0.21 c

## 2021-12-25 ENCOUNTER — Encounter: Payer: Self-pay | Admitting: Internal Medicine

## 2021-12-25 ENCOUNTER — Ambulatory Visit (INDEPENDENT_AMBULATORY_CARE_PROVIDER_SITE_OTHER): Payer: Medicare Other | Admitting: Internal Medicine

## 2021-12-25 ENCOUNTER — Ambulatory Visit (HOSPITAL_COMMUNITY): Payer: Medicare Other

## 2021-12-25 VITALS — BP 146/60 | HR 69 | Ht 61.0 in | Wt 163.0 lb

## 2021-12-25 DIAGNOSIS — I350 Nonrheumatic aortic (valve) stenosis: Secondary | ICD-10-CM

## 2021-12-25 NOTE — Patient Instructions (Signed)
Medication Instructions:  ?Your physician recommends that you continue on your current medications as directed. Please refer to the Current Medication list given to you today. ? ?*If you need a refill on your cardiac medications before your next appointment, please call your pharmacy* ? ?Lab Work: ? ?If you have labs (blood work) drawn today and your tests are completely normal, you will receive your results only by: ?MyChart Message (if you have MyChart) OR ?A paper copy in the mail ?If you have any lab test that is abnormal or we need to change your treatment, we will call you to review the results. ? ? ?Testing/Procedures: ?Your physician has requested that you have an echocardiogram 3 months before office appointment. Echocardiography is a painless test that uses sound waves to create images of your heart. It provides your doctor with information about the size and shape of your heart and how well your heart?s chambers and valves are working. This procedure takes approximately one hour. There are no restrictions for this procedure. ? ?Follow-Up: ?At [facility], you and your health needs are our priority.  As part of our continuing mission to provide you with exceptional heart care, we have created designated Provider Care Teams.  These Care Teams include your primary Cardiologist (physician) and Advanced Practice Providers (APPs -  Physician Assistants and Nurse Practitioners) who all work together to provide you with the care you need, when you need it. ? ?We recommend signing up for the patient portal called "MyChart".  Sign up information is provided on this After Visit Summary.  MyChart is used to connect with patients for Virtual Visits (Telemedicine).  Patients are able to view lab/test results, encounter notes, upcoming appointments, etc.  Non-urgent messages can be sent to your provider as well.   ?To learn more about what you can do with MyChart, go to [email or web address].   ? ?Your next  appointment:   ?3 month(s) ? ?The format for your next appointment:   ?In Person ? ?Provider:   ?Dr. Ali Lowe ? ? ?Important Information About Sugar ? ? ? ? ?  ?

## 2022-01-12 DIAGNOSIS — E113593 Type 2 diabetes mellitus with proliferative diabetic retinopathy without macular edema, bilateral: Secondary | ICD-10-CM | POA: Diagnosis not present

## 2022-01-26 ENCOUNTER — Ambulatory Visit: Payer: Medicare Other | Admitting: Internal Medicine

## 2022-02-13 DIAGNOSIS — I2510 Atherosclerotic heart disease of native coronary artery without angina pectoris: Secondary | ICD-10-CM | POA: Diagnosis not present

## 2022-02-13 DIAGNOSIS — I10 Essential (primary) hypertension: Secondary | ICD-10-CM | POA: Diagnosis not present

## 2022-02-13 DIAGNOSIS — Z794 Long term (current) use of insulin: Secondary | ICD-10-CM | POA: Diagnosis not present

## 2022-02-13 DIAGNOSIS — E039 Hypothyroidism, unspecified: Secondary | ICD-10-CM | POA: Diagnosis not present

## 2022-02-13 DIAGNOSIS — E1159 Type 2 diabetes mellitus with other circulatory complications: Secondary | ICD-10-CM | POA: Diagnosis not present

## 2022-02-13 DIAGNOSIS — E785 Hyperlipidemia, unspecified: Secondary | ICD-10-CM | POA: Diagnosis not present

## 2022-02-28 ENCOUNTER — Encounter: Payer: Medicare Other | Admitting: Surgery

## 2022-03-22 ENCOUNTER — Telehealth (HOSPITAL_COMMUNITY): Payer: Self-pay | Admitting: Internal Medicine

## 2022-03-22 NOTE — Telephone Encounter (Signed)
Patient cancelled echocardiogram for the reason below:  03/22/2022 9:55 AM DS:WVTV, JENNIFER L  Cancel Rsn: Patient (does not want to have test at this time)  Order will be removed from the active echo WQ. If patient calls in the future we can reinstate the order. Thank you.

## 2022-03-22 NOTE — Telephone Encounter (Signed)
Pt last seen as TAVR work up in April.  Was to follow up in 3 months with echo prior.  She has cancelled both echo and follow up.

## 2022-03-26 NOTE — Telephone Encounter (Signed)
Left message for pt to contact the structural heart team to discuss how she is feeling and arrange cardiology follow-up.

## 2022-03-27 NOTE — Telephone Encounter (Signed)
I spoke with the pt and she cancelled appointments because she is feeling well with no limitations in her ADLs.  The pt also has a number of medical bills that she is trying to pay off and does not want to add to this with an echo and office visit. The pt did agree to schedule follow up at the end of this year and I have scheduled her for an echo and office visit on 08/06/2022.  I reviewed symptoms that the pt should monitor for and the pt agreed to contact the office if she developed any symptoms.

## 2022-04-04 ENCOUNTER — Ambulatory Visit (HOSPITAL_COMMUNITY): Payer: Medicare Other

## 2022-04-10 ENCOUNTER — Ambulatory Visit: Payer: Medicare Other | Admitting: Internal Medicine

## 2022-04-27 DIAGNOSIS — I350 Nonrheumatic aortic (valve) stenosis: Secondary | ICD-10-CM | POA: Diagnosis not present

## 2022-04-27 DIAGNOSIS — E1142 Type 2 diabetes mellitus with diabetic polyneuropathy: Secondary | ICD-10-CM | POA: Diagnosis not present

## 2022-04-27 DIAGNOSIS — E039 Hypothyroidism, unspecified: Secondary | ICD-10-CM | POA: Diagnosis not present

## 2022-04-27 DIAGNOSIS — I6523 Occlusion and stenosis of bilateral carotid arteries: Secondary | ICD-10-CM | POA: Diagnosis not present

## 2022-04-27 DIAGNOSIS — E1159 Type 2 diabetes mellitus with other circulatory complications: Secondary | ICD-10-CM | POA: Diagnosis not present

## 2022-04-27 DIAGNOSIS — I2510 Atherosclerotic heart disease of native coronary artery without angina pectoris: Secondary | ICD-10-CM | POA: Diagnosis not present

## 2022-04-27 DIAGNOSIS — Z794 Long term (current) use of insulin: Secondary | ICD-10-CM | POA: Diagnosis not present

## 2022-04-27 DIAGNOSIS — E785 Hyperlipidemia, unspecified: Secondary | ICD-10-CM | POA: Diagnosis not present

## 2022-04-27 DIAGNOSIS — I10 Essential (primary) hypertension: Secondary | ICD-10-CM | POA: Diagnosis not present

## 2022-04-27 DIAGNOSIS — E559 Vitamin D deficiency, unspecified: Secondary | ICD-10-CM | POA: Diagnosis not present

## 2022-05-03 IMAGING — CT CT ANGIO CHEST
2 of 7 series · 15 of 36 positions shown · IV contrast (APPLIED)
Comparison: None.

CLINICAL DATA: Preop evaluation for aortic valve replacement

EXAM:
CTA ABDOMEN AND PELVIS WITHOUT AND WITH CONTRAST
TECHNIQUE: Multidetector CT imaging of the abdomen and pelvis was performed
using the standard protocol during bolus administration of
intravenous contrast. Multiplanar reconstructed images and MIPs were
obtained and reviewed to evaluate the vascular anatomy.

[Series 3: ax thins · axial · 0.59mm/px · z∈[-817,-238]mm · 14 of 669 slices shown]
[im 45/669  lung]
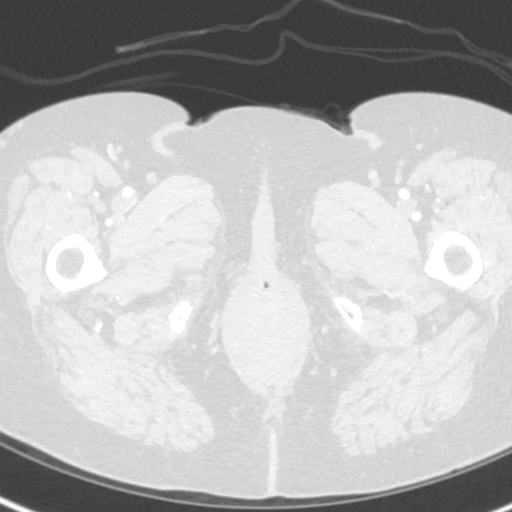
[im 90/669  mediastinal]
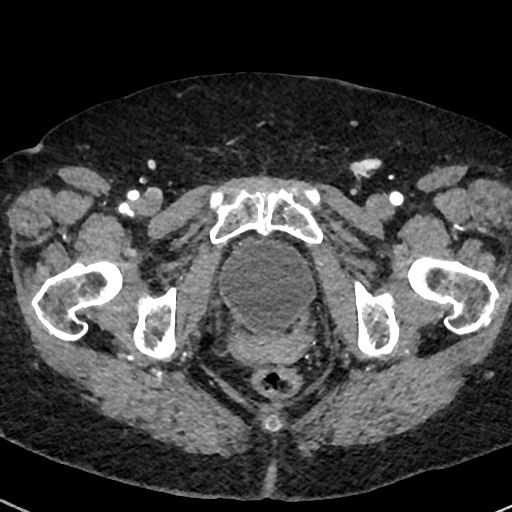
[im 134/669  lung]
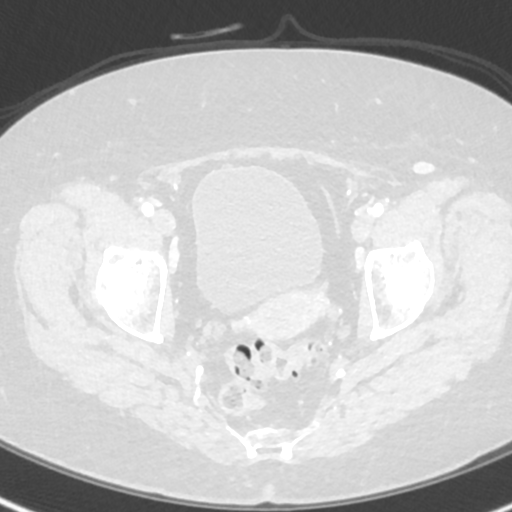
[im 179/669  mediastinal]
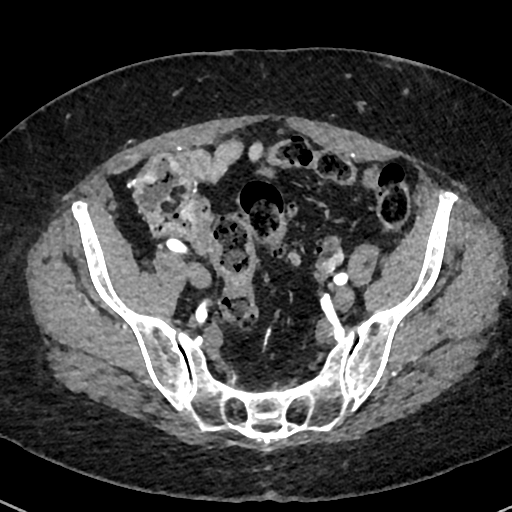
[im 223/669  lung]
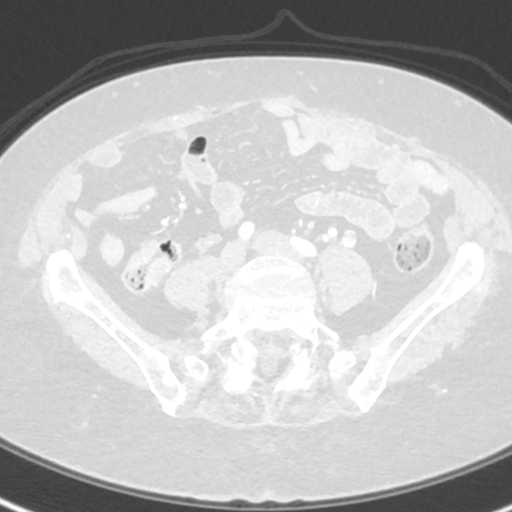
[im 268/669  mediastinal]
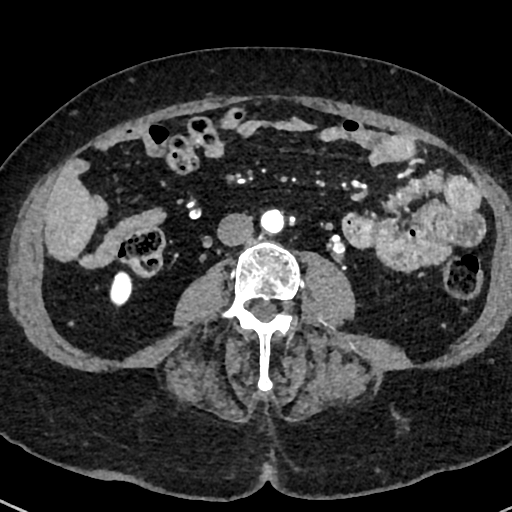
[im 312/669  lung]
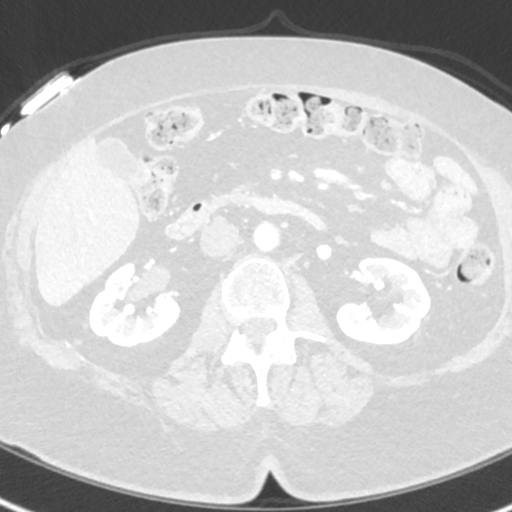
[im 357/669  mediastinal]
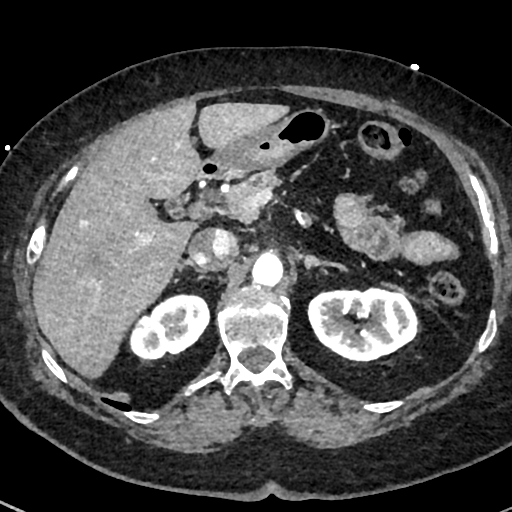
[im 401/669  lung]
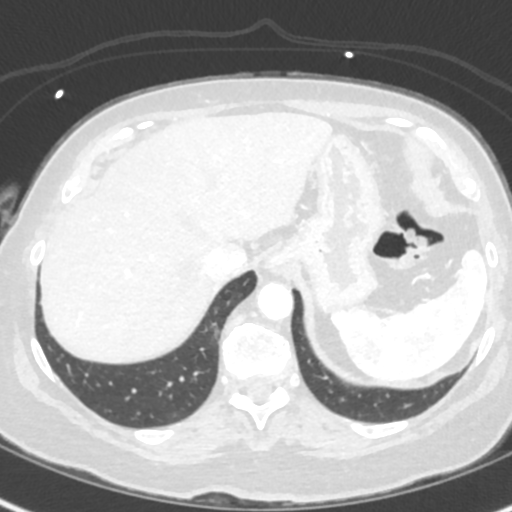
[im 446/669  mediastinal]
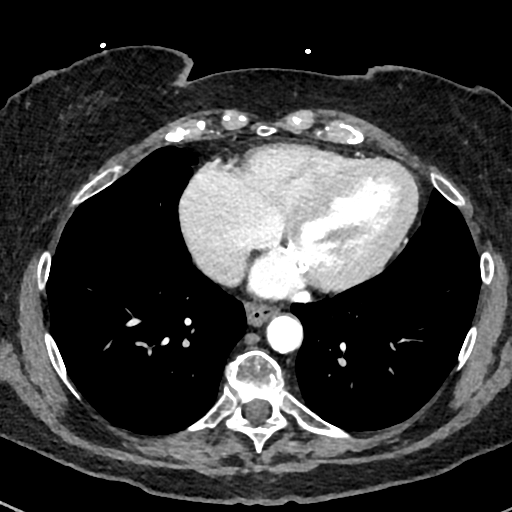
[im 490/669  lung]
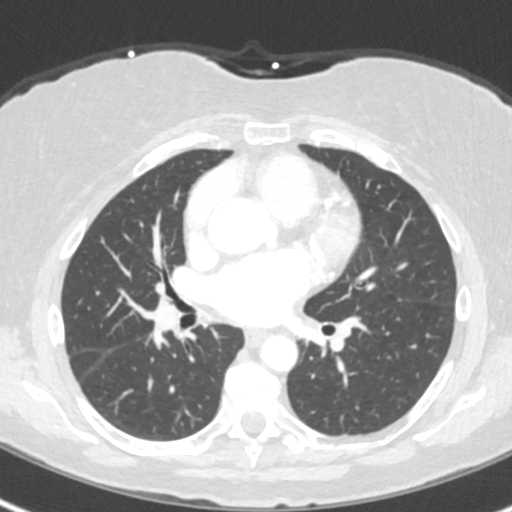
[im 535/669  mediastinal]
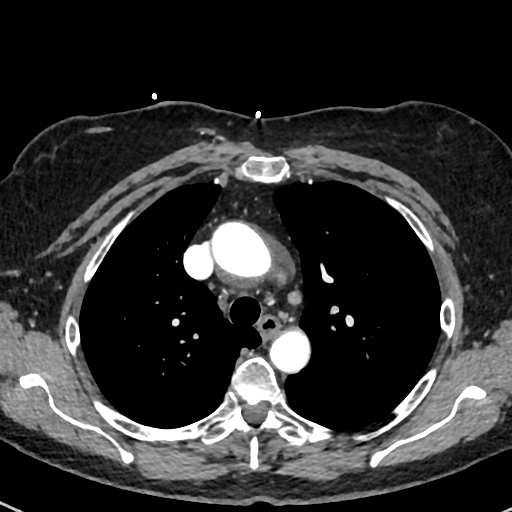
[im 579/669  lung]
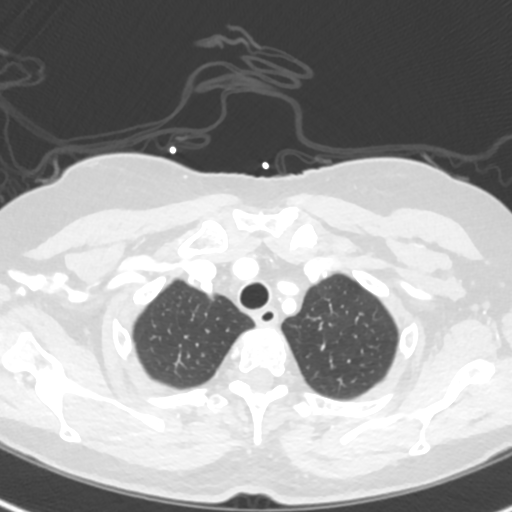
[im 624/669  mediastinal]
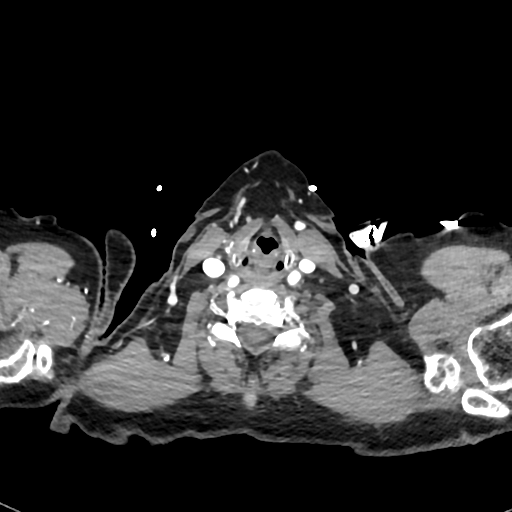

[Series 5: cor · coronal · 0.92mm/px · 1 of 151 slices shown]
[im 76/151  mediastinal]
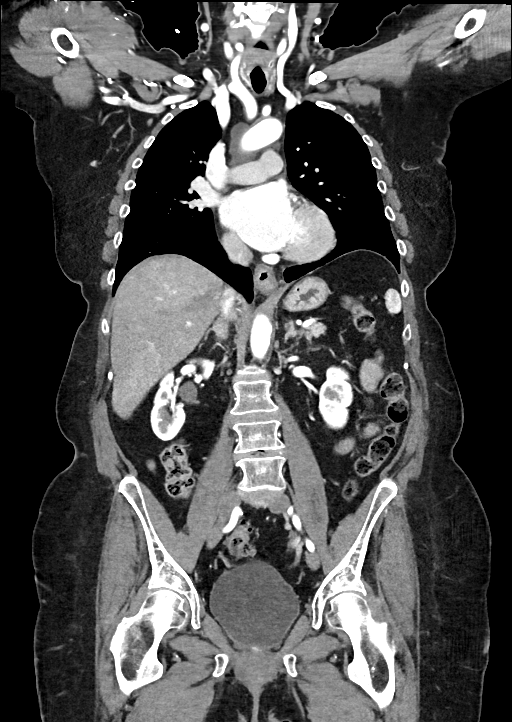

[15 of 36 positions shown; findings below may reference images not displayed]

RADIATION DOSE REDUCTION: This exam was performed according to the
departmental dose-optimization program which includes automated
exposure control, adjustment of the mA and/or kV according to
patient size and/or use of iterative reconstruction technique.

CONTRAST:  100mL OMNIPAQUE IOHEXOL 350 MG/ML SOLN
FINDINGS: CTA CHEST FINDINGS

Cardiovascular: Normal heart size. No significant pericardial
effusion/thickening. Aortic valve thickening and calcifications.
Mild atherosclerotic disease of the thoracic aorta. Great vessels
are normal in course and caliber. Three-vessel coronary artery
calcifications. No central pulmonary emboli.

Mediastinum/Nodes: Thyroid is unremarkable. Small hiatal hernia. No
pathologically enlarged axillary, mediastinal or hilar lymph nodes.

Lungs/Pleura: No pneumothorax. No pleural effusion. No
consolidation,

Musculoskeletal:  No aggressive appearing focal osseous lesions.

CTA ABDOMEN AND PELVIS FINDINGS

Hepatobiliary: Normal liver with no liver mass. Cholelithiasis. No
gallbladder wall thickening. No biliary ductal dilatation.

Pancreas: Normal, with no mass or duct dilation.

Spleen: Normal size. No mass.

Adrenals/Urinary Tract: Normal adrenals. Kidneys enhance
symmetrically with no evidence of hydronephrosis or nephrolithiasis.
Normal bladder.

Stomach/Bowel: Normal non-distended stomach. Normal caliber small
bowel with no small bowel wall thickening. Diverticulosis. No large
bowel wall thickening or pericolonic fat stranding.

Vascular/Lymphatic: Mild atherosclerotic disease of the abdominal
aorta. Normal caliber abdominal aorta severe narrowing at the
origins of the bilateral renal arteries due to calcified plaque,
otherwise major branch vessels are widely patent. No pathologically
enlarged lymph nodes in the abdomen or pelvis.

Reproductive: Uterus and bilateral adnexa are unremarkable.

Other: No pneumoperitoneum, ascites or focal fluid collection.

Musculoskeletal: No aggressive appearing focal osseous lesions.

VASCULAR MEASUREMENTS PERTINENT TO TAVR:

AORTA:

Minimal Aortic Eiameter-AQ.H mm

Severity of Aortic Calcification-mild

RIGHT PELVIS:

Right Common Iliac Artery -

Minimal 9iameter-X.V mm

Tortuosity-none

Calcification-moderate

Right External Iliac Artery -

Minimal Diameter-7.1 mm

Tortuosity-moderate

Calcification-none

Right Common Femoral Artery -

Minimal Xiameter-0.B mm

Tortuosity-none

Calcification-none

LEFT PELVIS:

Left Common Iliac Artery -

Minimal Giameter-0.M mm

Tortuosity-none

Calcification-mild

Left External Iliac Artery -

Minimal 9iameter-X.V mm

Tortuosity-none

Calcification-none

Left Common Femoral Artery -

Minimal Liameter-9.S mm

Tortuosity-none

Calcification-mild

Review of the MIP images confirms the above findings.
IMPRESSION: VASCULAR

1. Vascular findings and measurements pertinent to potential TAVR
procedure, as detailed above.
2. Severe thickening calcification of the aortic valve, compatible
with reported clinical history of severe aortic stenosis.
3. Mild aortoiliac atherosclerosis. Severe narrowing at the origins
of the bilateral renal arteries due to calcified plaque. Three
vessel coronary artery disease.

NON-VASCULAR

1. Diverticulosis with no diverticulitis.
2. Small hiatal hernia.

## 2022-05-03 IMAGING — CT CT HEART MORP W/ CTA COR W/ SCORE W/ CA W/CM &/OR W/O CM
2 of 8 series · 10 of 20 positions shown, 12 images · IV contrast (APPLIED)
Comparison: None.
COMPARISON: None.

Addendum:
EXAM:
OVER-READ INTERPRETATION  CT CHEST

The following report is an over-read performed by radiologist Dr.
Samore Apostol [REDACTED] on 11/29/2021. This
over-read does not include interpretation of cardiac or coronary
anatomy or pathology. The coronary calcium score/coronary CTA
interpretation by the cardiologist is attached.
CLINICAL DATA: JJ9with severe aortic stenosis being evaluated for a
TAVR procedure.
Cardiac TAVR CT
TECHNIQUE: The patient was scanned on a Phillips Force scanner. A 120 kV
retrospective scan was triggered in the descending thoracic aorta at
111 HU's. Gantry rotation speed was 250 msecs and collimation was .6
mm. No beta blockade or nitro were given. The 3D data set was
reconstructed in 5% intervals of the R-R cycle. Systolic and
diastolic phases were analyzed on a dedicated work station using
MPR, MIP and VRT modes. The patient received 80 cc of contrast.

[Series 12: 0-90% · axial · 0.39mm/px · z∈[-441,-311]mm · 5 of 3260 slices shown, 7 images]
[im 544/3260  vessel]
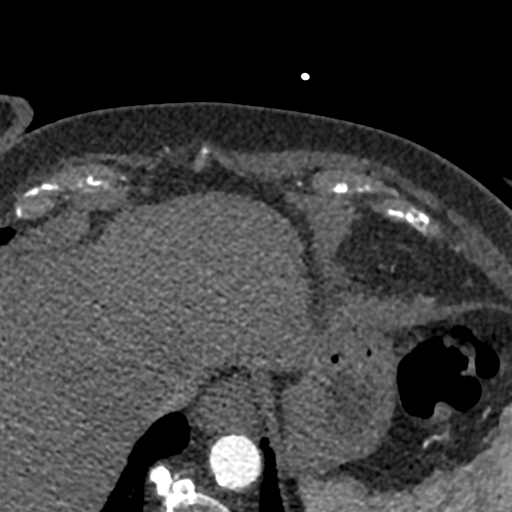
[im 544/3260  lung]
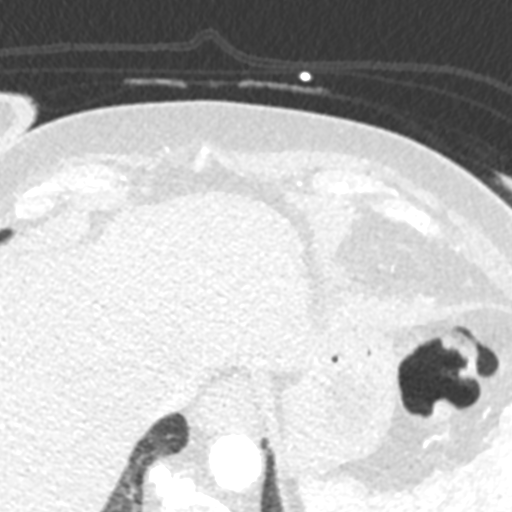
[im 1087/3260  vessel]
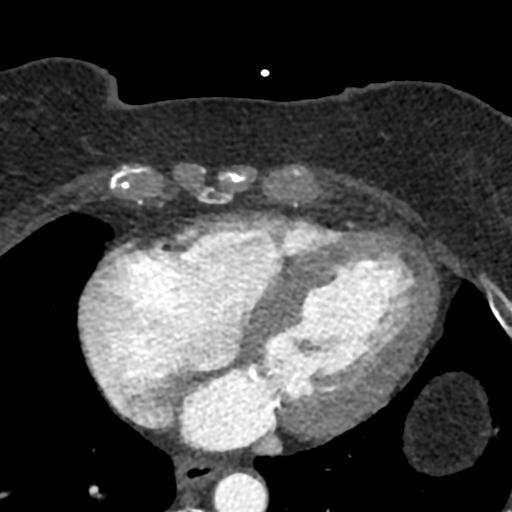
[im 1630/3260  vessel]
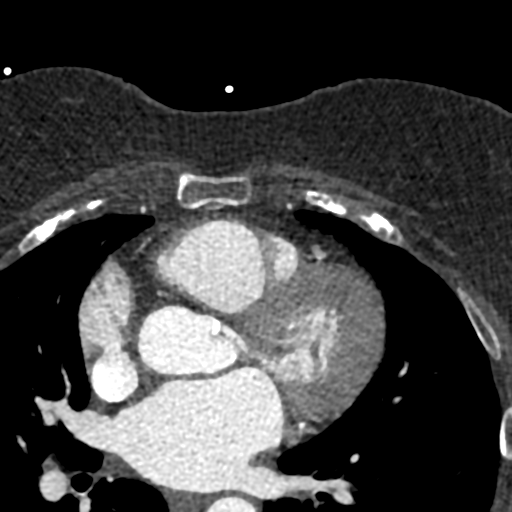
[im 2173/3260  vessel]
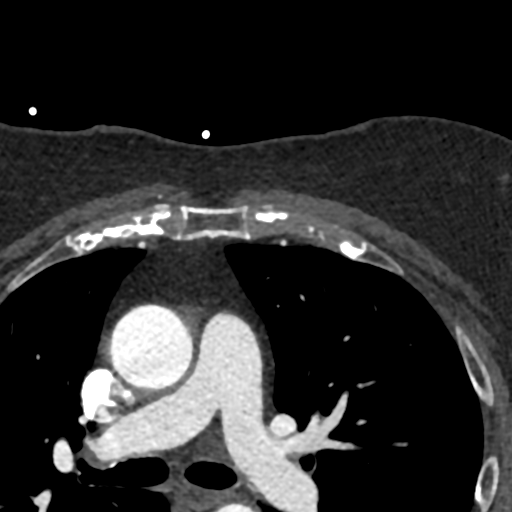
[im 2716/3260  vessel]
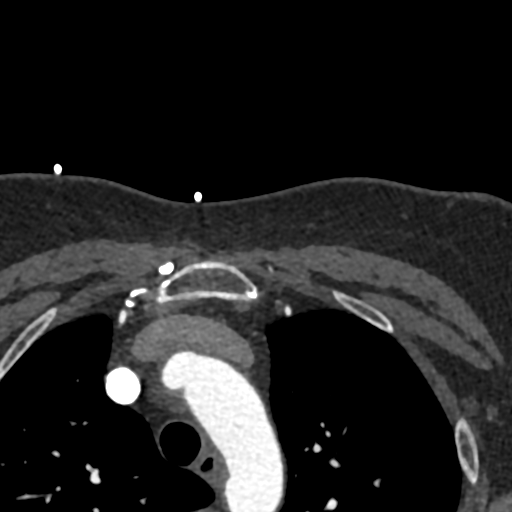
[im 2716/3260  lung]
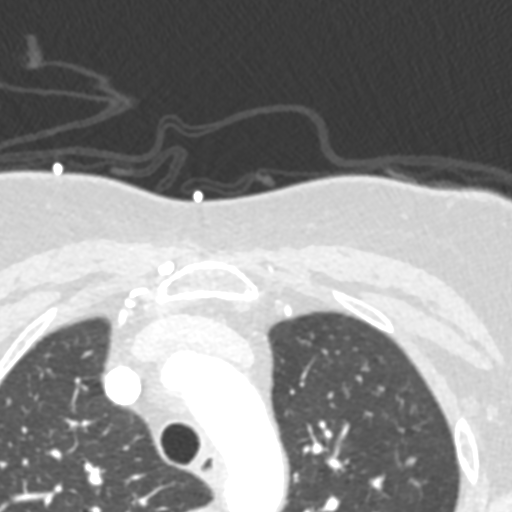

[Series 13: 5-95% · axial · 0.39mm/px · z∈[-441,-311]mm · 5 of 3260 slices shown]
[im 544/3260  vessel]
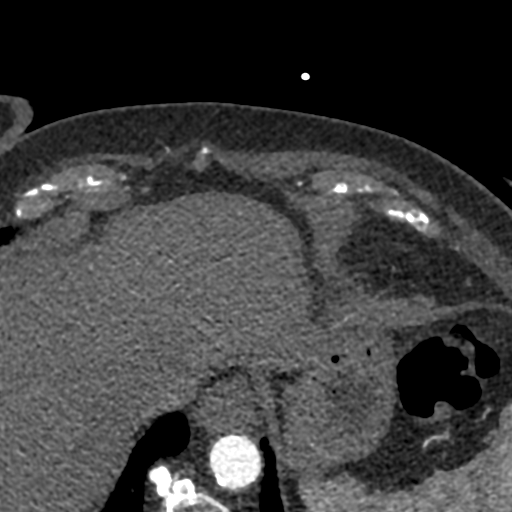
[im 1087/3260  vessel]
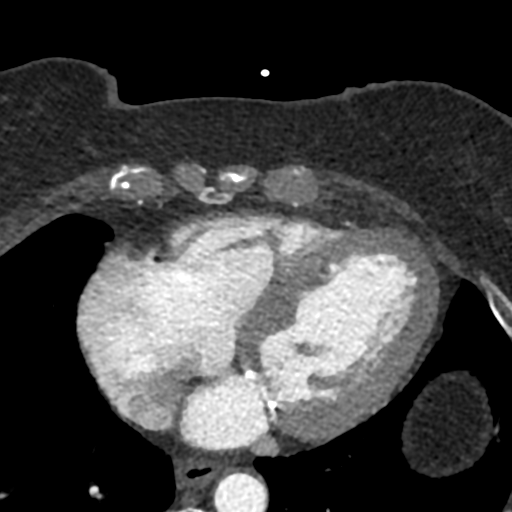
[im 1630/3260  vessel]
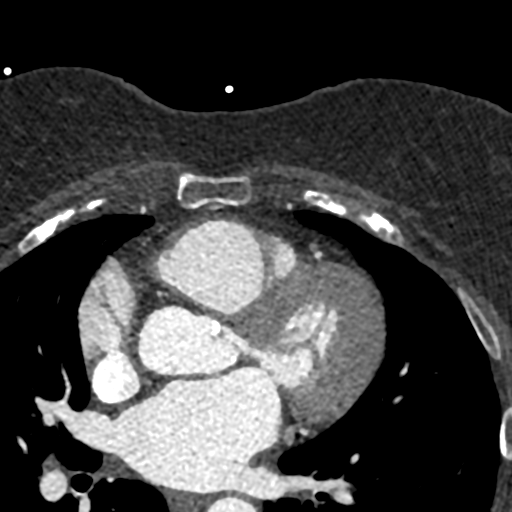
[im 2173/3260  vessel]
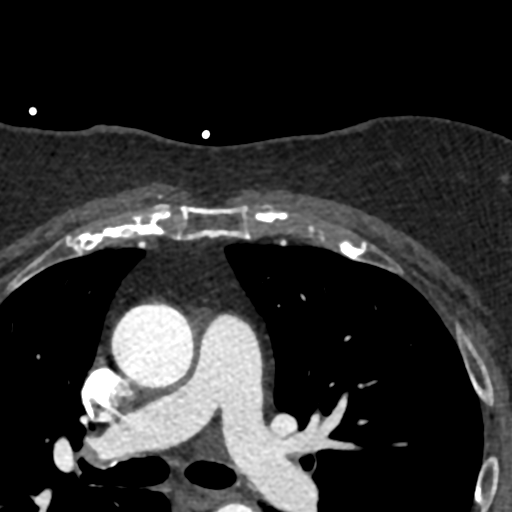
[im 2716/3260  vessel]
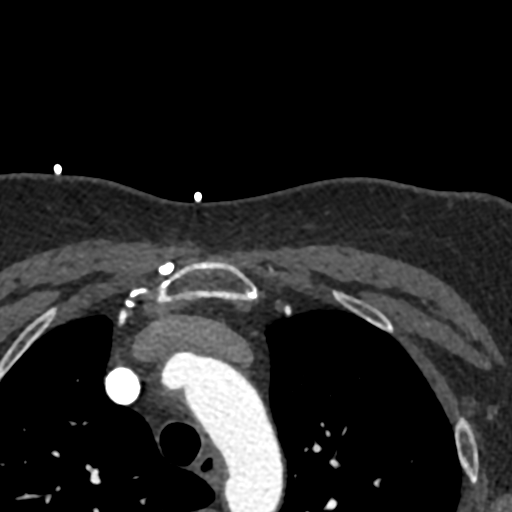

[10 of 20 positions shown; findings below may reference images not displayed]

FINDINGS: Extracardiac findings will be described separately under dictation
for contemporaneously obtained CTA chest, abdomen and pelvis.
IMPRESSION: Please see separate dictation for contemporaneously obtained CTA
chest, abdomen and pelvis dated 11/29/2021 for full description of
relevant extracardiac findings.
FINDINGS: Aortic Root:

Aortic valve: Trileaflet aortic valve with moderate calcifications

Aortic valve calcium score: 0132

Aortic annulus:

Diameter: 22mm x 18mm

Perimeter: 62mm

Area: 296 mm^2

Calcifications: No calcifications

Coronary height: Min Left - 12mm, Max Left - 16mm; Min Right - 10mm

Sinotubular height: Left cusp - 17mm; Right cusp - 16mm; Noncoronary
cusp - 19mm

LVOT (as measured 3 mm below the annulus): 23mm x 18mm

Diameter: 20mm

Area: 304 mm^2

Calcifications: No calcifications

Aortic sinus width: Left cusp - 25mm; Right cusp - 24mm; Noncoronary
cusp - 25mm

Sinotubular junction width: 27mm x 25mm

Optimum Fluoroscopic Angle for Delivery: RAO 4 RINNAH 5

Cardiac:

Right atrium: Mild enlargement

Right ventricle: Normal size

Pulmonary arteries: Normal size

Pulmonary veins: Normal configuration

Left atrium: Normal size

Left ventricle: Normal size

Pericardium: Normal thickness

Coronary arteries:

Calcium score 790 (91st percentile)

Normal coronary origin.  Right dominance.

Dense coronary calcifications extending from proximal to mid LAD,
obscuring lumen in the mid LAD. Unable to exclude obstructive CAD.
IMPRESSION: 1. Trileaflet aortic valve with moderate calcifications (AV calcium
score 0132)

2. Aortic annulus measures 22mm x 18mm in diameter with perimeter
62mm and area 296 mm^2. No annular or LVOT calcifications. Annular
size is appropriate for a 23mm Evolut valve; however sinus of
Valsalva diameter measures 24mm at the right coronary cusp, which is
below the recommended minimum diameter (25mm) for placement of this
valve. Recommend further discussion at structural heart conference,
could consider 20mm Sapien 3 valve

3. Low coronary height to RCA, measuring 10mm. Adequate coronary
height to left main (12mm)

4.  Optimum Fluoroscopic Angle for Delivery:  RAO 4 RINNAH 5

5.  Coronary calcium score 790 (91st percentile)

6. Nitroglycerin was not administered, which limits evaluation of
coronaries. There are dense coronary calcifications extending from
the proximal to mid LAD, obscuring the lumen in the mid LAD. Unable
to exclude obstructive CAD. Cannot send study for CT FFR, as CT FFR
modeling requires nitroglycerin induced hyperemia. Recommend cardiac
catheterization

*** End of Addendum ***
EXAM:
OVER-READ INTERPRETATION  CT CHEST

The following report is an over-read performed by radiologist Dr.
Samore Apostol [REDACTED] on 11/29/2021. This
over-read does not include interpretation of cardiac or coronary
anatomy or pathology. The coronary calcium score/coronary CTA
interpretation by the cardiologist is attached.
FINDINGS: Extracardiac findings will be described separately under dictation
for contemporaneously obtained CTA chest, abdomen and pelvis.
IMPRESSION: Please see separate dictation for contemporaneously obtained CTA
chest, abdomen and pelvis dated 11/29/2021 for full description of
relevant extracardiac findings.

## 2022-07-03 DIAGNOSIS — I2510 Atherosclerotic heart disease of native coronary artery without angina pectoris: Secondary | ICD-10-CM | POA: Diagnosis not present

## 2022-07-03 DIAGNOSIS — Z7189 Other specified counseling: Secondary | ICD-10-CM | POA: Diagnosis not present

## 2022-07-03 DIAGNOSIS — Z794 Long term (current) use of insulin: Secondary | ICD-10-CM | POA: Diagnosis not present

## 2022-07-03 DIAGNOSIS — E1159 Type 2 diabetes mellitus with other circulatory complications: Secondary | ICD-10-CM | POA: Diagnosis not present

## 2022-07-03 DIAGNOSIS — I10 Essential (primary) hypertension: Secondary | ICD-10-CM | POA: Diagnosis not present

## 2022-07-18 ENCOUNTER — Telehealth: Payer: Self-pay

## 2022-07-18 NOTE — Telephone Encounter (Signed)
The pt called into the office requesting to reschedule 12/14 Echo and Dr Ali Lowe office visit.  The pt states that she is feeling well and denies any symptoms of CP, SOB, dizziness or passing out.  The pt states that she is very active and has been raking and getting up leaves without any issues. I advised the pt that she does need to have follow up on her aortic valve and that I would like her to at least have an echo in February 2024, which will be one year since her last.  The pt agreed with plan, Echo and office visit scheduled 10/15/22.  The pt will contact the structural heart team if she develops any symptoms.

## 2022-08-06 ENCOUNTER — Other Ambulatory Visit (HOSPITAL_COMMUNITY): Payer: Medicare Other

## 2022-08-06 ENCOUNTER — Ambulatory Visit: Payer: Medicare Other | Admitting: Internal Medicine

## 2022-09-04 DIAGNOSIS — E1159 Type 2 diabetes mellitus with other circulatory complications: Secondary | ICD-10-CM | POA: Diagnosis not present

## 2022-09-04 DIAGNOSIS — E039 Hypothyroidism, unspecified: Secondary | ICD-10-CM | POA: Diagnosis not present

## 2022-09-04 DIAGNOSIS — E559 Vitamin D deficiency, unspecified: Secondary | ICD-10-CM | POA: Diagnosis not present

## 2022-09-04 DIAGNOSIS — E1142 Type 2 diabetes mellitus with diabetic polyneuropathy: Secondary | ICD-10-CM | POA: Diagnosis not present

## 2022-09-04 DIAGNOSIS — G720 Drug-induced myopathy: Secondary | ICD-10-CM | POA: Diagnosis not present

## 2022-09-04 DIAGNOSIS — E785 Hyperlipidemia, unspecified: Secondary | ICD-10-CM | POA: Diagnosis not present

## 2022-09-04 DIAGNOSIS — I6523 Occlusion and stenosis of bilateral carotid arteries: Secondary | ICD-10-CM | POA: Diagnosis not present

## 2022-09-04 DIAGNOSIS — I350 Nonrheumatic aortic (valve) stenosis: Secondary | ICD-10-CM | POA: Diagnosis not present

## 2022-09-04 DIAGNOSIS — Z794 Long term (current) use of insulin: Secondary | ICD-10-CM | POA: Diagnosis not present

## 2022-09-04 DIAGNOSIS — I2510 Atherosclerotic heart disease of native coronary artery without angina pectoris: Secondary | ICD-10-CM | POA: Diagnosis not present

## 2022-09-04 DIAGNOSIS — I10 Essential (primary) hypertension: Secondary | ICD-10-CM | POA: Diagnosis not present

## 2022-10-09 ENCOUNTER — Telehealth: Payer: Self-pay

## 2022-10-09 NOTE — Telephone Encounter (Signed)
Pt called into the office to reschedule her 10/15/22 Echo and Dr Ali Lowe evaluation. The pt is feeling well and denies any symptoms of SOB, CP, fatigue, dizziness or passing out. The pt would like to post pone her appointments for 3 months.  Appointments rescheduled to 01/21/23. The pt was advised to contact me with any new symptoms.

## 2022-10-15 ENCOUNTER — Other Ambulatory Visit (HOSPITAL_COMMUNITY): Payer: Medicare Other

## 2022-10-15 ENCOUNTER — Ambulatory Visit: Payer: Medicare Other | Admitting: Internal Medicine

## 2022-11-28 DIAGNOSIS — E1159 Type 2 diabetes mellitus with other circulatory complications: Secondary | ICD-10-CM | POA: Diagnosis not present

## 2022-11-28 DIAGNOSIS — I2510 Atherosclerotic heart disease of native coronary artery without angina pectoris: Secondary | ICD-10-CM | POA: Diagnosis not present

## 2022-11-28 DIAGNOSIS — E039 Hypothyroidism, unspecified: Secondary | ICD-10-CM | POA: Diagnosis not present

## 2022-11-28 DIAGNOSIS — Z794 Long term (current) use of insulin: Secondary | ICD-10-CM | POA: Diagnosis not present

## 2022-11-28 DIAGNOSIS — I10 Essential (primary) hypertension: Secondary | ICD-10-CM | POA: Diagnosis not present

## 2022-12-31 DIAGNOSIS — Z1331 Encounter for screening for depression: Secondary | ICD-10-CM | POA: Diagnosis not present

## 2022-12-31 DIAGNOSIS — I350 Nonrheumatic aortic (valve) stenosis: Secondary | ICD-10-CM | POA: Diagnosis not present

## 2022-12-31 DIAGNOSIS — E039 Hypothyroidism, unspecified: Secondary | ICD-10-CM | POA: Diagnosis not present

## 2022-12-31 DIAGNOSIS — Z794 Long term (current) use of insulin: Secondary | ICD-10-CM | POA: Diagnosis not present

## 2022-12-31 DIAGNOSIS — E785 Hyperlipidemia, unspecified: Secondary | ICD-10-CM | POA: Diagnosis not present

## 2022-12-31 DIAGNOSIS — I2510 Atherosclerotic heart disease of native coronary artery without angina pectoris: Secondary | ICD-10-CM | POA: Diagnosis not present

## 2022-12-31 DIAGNOSIS — I10 Essential (primary) hypertension: Secondary | ICD-10-CM | POA: Diagnosis not present

## 2022-12-31 DIAGNOSIS — E1159 Type 2 diabetes mellitus with other circulatory complications: Secondary | ICD-10-CM | POA: Diagnosis not present

## 2022-12-31 DIAGNOSIS — E559 Vitamin D deficiency, unspecified: Secondary | ICD-10-CM | POA: Diagnosis not present

## 2022-12-31 DIAGNOSIS — I6523 Occlusion and stenosis of bilateral carotid arteries: Secondary | ICD-10-CM | POA: Diagnosis not present

## 2022-12-31 DIAGNOSIS — E1142 Type 2 diabetes mellitus with diabetic polyneuropathy: Secondary | ICD-10-CM | POA: Diagnosis not present

## 2022-12-31 DIAGNOSIS — G4762 Sleep related leg cramps: Secondary | ICD-10-CM | POA: Diagnosis not present

## 2023-01-16 NOTE — Progress Notes (Signed)
Patient ID: Sarah Shelton MRN: [id number] DOB/AGE: 1945-11-06 77 y.o.  Primary Care Physician:South, Jeannett Senior, MD Primary Cardiologist: Armanda Magic, MD     FOCUSED CARDIOVASCULAR PROBLEM LIST: 1.  Severe aortic stenosis with an aortic valve area 0.55 cm, dimensionless index of less than 0.27, and a mean gradient of 48 mmHg (frame 76, TTE 10/18/21) with mild aortic insufficiency; ejection fraction of 65 to 70%; no conduction abnormalities 2.  Type 2 diabetes 3.  Hypertension 4.  Hyperlipidemia  HISTORY OF PRESENT ILLNESS: The patient is a 77 y.o. female with the indicated medical history here for 46-month follow-up.    February 2023: When I saw her last she was referred for cardiac catheterization after stress test demonstrated some EKG changes.  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  She was asymptomatic from a dyspnea or anginal standpoint so no further interventions were pursued at that time.  April 2023: The patient continues to do very well.  She denies any exertional dyspnea, exertional angina, presyncope or syncope.  She is doing all of her activities of daily living without any limitations.  She denies any fatigue.  She has required no unplanned hospitalizations or emergency room visits.  She recently had an outpatient eye procedure done.  She is otherwise well and without complaints.  Plan: Given lack of significant symptoms follow-up in 3 months with echocardiogram.  Today: The patient continues to do well.  She denies any presyncope, syncope, chest pain, increasing fatigue, or exertional dyspnea.  She tells me she is busier now than she was before.  She fortunately has not required any emergency room visits or hospitalizations.  She is otherwise well and without significant complaints today.  Past Medical History:  Diagnosis Date   Aortic stenosis    Bilateral hearing loss    wears hearing aids   Candida albicans  infection    Carotid stenosis    Cellulitis of right lower extremity without foot    Chronic venous hypertension (idiopathic) with ulcer and inflammation of right lower extremity (CODE)    Diabetes mellitus without complication (HCC)    Type 2, FreeStyle Libre-sensor located on upper left arm   Diabetic neuropathy (HCC)    Diabetic polyneuropathy (HCC)    Goiter    Hyperlipidemia    Hypertension    pt denies this dx as of 12/13/21   Hyperthyroidism    Hypothyroidism    LVH (left ventricular hypertrophy)    Nonrheumatic aortic valve stenosis    Scratches    CAT   Sleep related leg cramps    Tendon pain    Ulcer of left lower extremity (HCC)    Ulcer of right lower extremity (HCC)    Unspecified abnormalities of gait and mobility    Unsteady gait    Vasculitis (HCC)    Vitamin D deficiency    Wears dentures    full    Past Surgical History:  Procedure Laterality Date   CATARACT EXTRACTION Bilateral    HERNIA REPAIR     KENALOG INJECTION Right 12/14/2021   Procedure: AVASTIN INJECTION;  Surgeon: Stephannie Li, MD;  Location: [facility] OR;  Service: Ophthalmology;  Laterality: Right;   PARS PLANA VITRECTOMY Right 12/14/2021   Procedure: PARS PLANA VITRECTOMY WITH 25 GAUGE;  Surgeon: Stephannie Li, MD;  Location: [facility] OR;  Service: Ophthalmology;  Laterality: Right;   PHOTOCOAGULATION WITH LASER Right 12/14/2021   Procedure: PHOTOCOAGULATION WITH LASER;  Surgeon: Stephannie Li, MD;  Location: [facility] OR;  Service: Ophthalmology;  Laterality: Right;   RIGHT/LEFT HEART CATH AND CORONARY ANGIOGRAPHY N/A 12/11/2021   Procedure: RIGHT/LEFT HEART CATH AND CORONARY ANGIOGRAPHY;  Surgeon: Orbie Pyo, MD;  Location: [facility] INVASIVE CV LAB;  Service: Cardiovascular;  Laterality: N/A;    Family History  Problem Relation Age of Onset   Thyroid cancer Mother    CAD Mother 26       61   Diabetes Father     Social History   Socioeconomic History   Marital status: Single    Spouse name: Not on  file   Number of children: Not on file   Years of education: Not on file   Highest education level: Not on file  Occupational History   Not on file  Tobacco Use   Smoking status: Never   Smokeless tobacco: Never  Vaping Use   Vaping Use: Never used  Substance and Sexual Activity   Alcohol use: Never   Drug use: Never   Sexual activity: Not Currently    Birth control/protection: Post-menopausal  Other Topics Concern   Not on file  Social History Narrative   Not on file   Social Determinants of Health   Financial Resource Strain: Not on file  Food Insecurity: Not on file  Transportation Needs: Not on file  Physical Activity: Not on file  Stress: Not on file  Social Connections: Not on file  Intimate Partner Violence: Not on file     Prior to Admission medications   Medication Sig Start Date End Date Taking? Authorizing Provider  aspirin EC 81 MG tablet Take 81 mg by mouth daily. Swallow whole.    [provider]  Continuous Blood Gluc Sensor (FREESTYLE LIBRE 2 SENSOR) MISC CHANGE EVERY 14 DAYS TO MONITOR BLOOD GLUCOSE CONTINUOUSLY 11/23/21   [provider]  Continuous Blood Gluc Sensor (FREESTYLE LIBRE 2 SENSOR) MISC CHANGE EVERY 14 DAYS TO MONITOR BLOOD GLUCOSE CONTINUOUSLY    [provider]  Cyanocobalamin (VITAMIN B12) 1000 MCG TBCR Take 1,000 mcg by mouth daily.    [provider]  insulin glargine, 2 Unit Dial, (TOUJEO MAX SOLOSTAR) 300 UNIT/ML Solostar Pen Inject 22 Units into the skin at bedtime.    [provider]  levothyroxine (SYNTHROID) 125 MCG tablet Take 125 mcg by mouth See admin instructions. Take every day except Sunday    [provider]  NOVOLOG FLEXPEN 100 UNIT/ML FlexPen 9 Units 3 (three) times daily with meals. 11/23/21   [provider]  ofloxacin (OCUFLOX) 0.3 % ophthalmic solution Place 1 drop into the right eye 4 (four) times daily. 11/03/21   [provider]  prednisoLONE acetate  (PRED FORTE) 1 % ophthalmic suspension Place 1 drop into the right eye 4 (four) times daily. 11/03/21   [provider]  Vitamin D, Ergocalciferol, (DRISDOL) 1.25 MG (50000 UNIT) CAPS capsule Take 50,000 Units by mouth every 7 (seven) days.    [provider]    Allergies  Allergen Reactions   Adhesive [Tape] Rash    Reaction to plastic bandages - fabric bandages ok Paper Tape is okay to use.   Cortisone Rash    Topical cortisone    REVIEW OF SYSTEMS:  General: no fevers/chills/night sweats Eyes: no blurry vision, diplopia, or amaurosis ENT: no sore throat or hearing loss Resp: no cough, wheezing, or hemoptysis CV: no edema or palpitations GI: no abdominal pain, nausea, vomiting, diarrhea, or constipation GU: no dysuria, frequency, or hematuria Skin: no  rash Neuro: no headache, numbness, tingling, or weakness of extremities Musculoskeletal: no joint pain or swelling Heme: no bleeding, DVT, or easy bruising Endo: no polydipsia or polyuria  BP (!) 180/65   Pulse 67   Ht 5\' 1"  (1.549 m)   Wt 150 lb (68 kg)   SpO2 99%   BMI 28.34 kg/m   PHYSICAL EXAM: GEN:  AO x 3 in no acute distress HEENT: normal Dentition: Normal Neck: JVP normal. +1 carotid upstrokes with radiation of aortic stenosis murmur. No thyromegaly. Lungs: equal expansion, clear bilaterally CV: Apex is discrete and nondisplaced, RRR with 4/6 SEM late peaking Abd: soft, non-tender, non-distended; no bruit; positive bowel sounds Ext: no edema, ecchymoses, or cyanosis Vascular: 2+ femoral pulses, 2+ radial pulses       Skin: warm and dry without rash Neuro: CN II-XII grossly intact; motor and sensory grossly intact    DATA AND STUDIES:    EKG: Sinus rhythm with premature atrial contractions with a septal infarction pattern 2D ECHO:   April 2023 Final report not yet available but on my review ejection fraction looks relatively preserved.  The patient has critical aortic stenosis with an  aortic valve area of 0.49, mean gradient of 50 mmHg, and a peak velocity of 4.3 m/s with mild to moderate aortic insufficiency and mild to moderate mitral regurgitation.  February 2023  1. Left ventricular ejection fraction, by estimation, is 65 to 70%. The  left ventricle has normal function. The left ventricle has no regional  wall motion abnormalities. Left ventricular diastolic parameters are  consistent with Grade II diastolic  dysfunction (pseudonormalization). Elevated left atrial pressure.   2. Right ventricular systolic function is normal. The right ventricular  size is normal. There is mildly elevated pulmonary artery systolic  pressure.   3. Left atrial size was moderately dilated.   4. Mitral valve is thickened, with mildly restricted motion Mean gradient  through the valve is 3 mm Hg. Mild mitral valve regurgitation.   5. AV is thickened, calcified with restricted motion. Peak and mean  gradients through the valve are 62 and 39 mm Hg respectively. AVA (VTI) is  0.62 cm2. Dimensionless index is 0.21 consistent with severe AS. Compared  to previous echo, gradients are  increased and dimensionless index is lower (0.27 to 0.21) . Aortic valve  regurgitation is mild.    CARDIAC CATH: April 2023: Moderate disease of right PDA with normal cardiac output and cardiac index.   STS RISK CALCULATOR: Pending   NHYA CLASS: 1    ASSESSMENT AND PLAN:   Severe aortic stenosis - Plan: EKG 12-Lead  Type 2 diabetes mellitus with complication, with long-term current use of insulin (HCC)  Hypertension associated with diabetes (HCC)  Hyperlipidemia associated with type 2 diabetes mellitus (HCC)  On my review of the patient's echocardiogram she has now progressed to critical aortic stenosis.  She has associated mild aortic insufficiency and mitral regurgitation.  The patient is symptomatically well.  I discussed with the patient perhaps obtaining an exercise treadmill stress test to  objectively assess her exercise capacity as well as a BNP.  She quite clearly wants to defer this and any testing due to financial concerns regarding her insurance coverage.  I reiterated with the patient the importance of monitoring for symptoms of increasing dyspnea, fatigue, and shortness of breath she will contact our office if this were to develop.  Additionally I was quite clear about the need for admission to this hospital if she were  to develop any transient syncope, or presyncope.  She understands this as well.  We will otherwise see her back in 3 months.  I have personally reviewed the patients imaging data as summarized above.  I have reviewed the natural history of aortic stenosis with the patient and family members who are present today. We have discussed the limitations of medical therapy and the poor prognosis associated with symptomatic aortic stenosis. We have also reviewed potential treatment options, including palliative medical therapy, conventional surgical aortic valve replacement, and transcatheter aortic valve replacement. We discussed treatment options in the context of this patient's specific comorbid medical conditions.   All of the patient's questions were answered today. Will make further recommendations based on the results of studies outlined above.   Total time spent with patient today 30 minutes. This includes reviewing records, evaluating the patient and coordinating care.   Orbie Pyo, MD  01/21/2023 11:21 AM    [facility] [address] [place], [place], [place]  [address] Phone: [phone]; Fax: [phone]

## 2023-01-21 ENCOUNTER — Ambulatory Visit (INDEPENDENT_AMBULATORY_CARE_PROVIDER_SITE_OTHER): Payer: Medicare Other | Admitting: Internal Medicine

## 2023-01-21 ENCOUNTER — Encounter: Payer: Self-pay | Admitting: Internal Medicine

## 2023-01-21 ENCOUNTER — Ambulatory Visit (HOSPITAL_COMMUNITY): Payer: Medicare Other | Attending: Cardiovascular Disease

## 2023-01-21 VITALS — BP 180/65 | HR 67 | Ht 61.0 in | Wt 150.0 lb

## 2023-01-21 DIAGNOSIS — E1159 Type 2 diabetes mellitus with other circulatory complications: Secondary | ICD-10-CM

## 2023-01-21 DIAGNOSIS — E118 Type 2 diabetes mellitus with unspecified complications: Secondary | ICD-10-CM

## 2023-01-21 DIAGNOSIS — I152 Hypertension secondary to endocrine disorders: Secondary | ICD-10-CM

## 2023-01-21 DIAGNOSIS — E785 Hyperlipidemia, unspecified: Secondary | ICD-10-CM | POA: Insufficient documentation

## 2023-01-21 DIAGNOSIS — I350 Nonrheumatic aortic (valve) stenosis: Secondary | ICD-10-CM | POA: Diagnosis not present

## 2023-01-21 DIAGNOSIS — Z794 Long term (current) use of insulin: Secondary | ICD-10-CM

## 2023-01-21 DIAGNOSIS — E1169 Type 2 diabetes mellitus with other specified complication: Secondary | ICD-10-CM | POA: Insufficient documentation

## 2023-01-21 DIAGNOSIS — I351 Nonrheumatic aortic (valve) insufficiency: Secondary | ICD-10-CM

## 2023-01-21 LAB — ECHOCARDIOGRAM COMPLETE
AR max vel: 0.48 cm2
AV Area VTI: 0.44 cm2
AV Area mean vel: 0.45 cm2
AV Mean grad: 55 mmHg
AV Peak grad: 79.6 mmHg
Ao pk vel: 4.46 m/s
Area-P 1/2: 3.92 cm2
MV M vel: 6.28 m/s
MV Peak grad: 157.9 mmHg
P 1/2 time: 444 msec
S' Lateral: 3 cm

## 2023-01-21 NOTE — Patient Instructions (Signed)
Medication Instructions:  Your physician recommends that you continue on your current medications as directed. Please refer to the Current Medication list given to you today.  *If you need a refill on your cardiac medications before your next appointment, please call your pharmacy*  Follow-Up: At [facility], you and your health needs are our priority.  As part of our continuing mission to provide you with exceptional heart care, we have created designated Provider Care Teams.  These Care Teams include your primary Cardiologist (physician) and Advanced Practice Providers (APPs -  Physician Assistants and Nurse Practitioners) who all work together to provide you with the care you need, when you need it.  We recommend signing up for the patient portal called "MyChart".  Sign up information is provided on this After Visit Summary.  MyChart is used to connect with patients for Virtual Visits (Telemedicine).  Patients are able to view lab/test results, encounter notes, upcoming appointments, etc.  Non-urgent messages can be sent to your provider as well.   To learn more about what you can do with MyChart, go to [email or web address].    Your next appointment:   3 month(s)  Provider:   With Structural APP

## 2023-04-17 ENCOUNTER — Telehealth: Payer: Self-pay | Admitting: Physician Assistant

## 2023-04-17 NOTE — Telephone Encounter (Signed)
  [facility]   MULTIDISCIPLINARY HEART VALVE TEAM   Patient called into the office to cancel her upcoming appointment with our team 8/23 to follow up her very severe (almost critical AS). I had a very in depth discussion with her about the potential consequences of deferring work up and she understands. She said she feels wonderful and that she is 77 yo but feels and works as if she is 49. She says if she "drops dead while doing something she loves, she is okay with that." I told her to let us know if anything changes and we will get her in for an apt immediately. She would not like to r/s at this time and says she will call us back.    Cline Crock PA-C  [facility]

## 2023-04-24 ENCOUNTER — Ambulatory Visit: Payer: Medicare Other

## 2023-05-01 DIAGNOSIS — E1142 Type 2 diabetes mellitus with diabetic polyneuropathy: Secondary | ICD-10-CM | POA: Diagnosis not present

## 2023-05-01 DIAGNOSIS — I2510 Atherosclerotic heart disease of native coronary artery without angina pectoris: Secondary | ICD-10-CM | POA: Diagnosis not present

## 2023-05-01 DIAGNOSIS — I10 Essential (primary) hypertension: Secondary | ICD-10-CM | POA: Diagnosis not present

## 2023-05-01 DIAGNOSIS — I6523 Occlusion and stenosis of bilateral carotid arteries: Secondary | ICD-10-CM | POA: Diagnosis not present

## 2023-05-01 DIAGNOSIS — I350 Nonrheumatic aortic (valve) stenosis: Secondary | ICD-10-CM | POA: Diagnosis not present

## 2023-05-01 DIAGNOSIS — Z794 Long term (current) use of insulin: Secondary | ICD-10-CM | POA: Diagnosis not present

## 2023-05-01 DIAGNOSIS — I872 Venous insufficiency (chronic) (peripheral): Secondary | ICD-10-CM | POA: Diagnosis not present

## 2023-05-01 DIAGNOSIS — G4762 Sleep related leg cramps: Secondary | ICD-10-CM | POA: Diagnosis not present

## 2023-05-01 DIAGNOSIS — E785 Hyperlipidemia, unspecified: Secondary | ICD-10-CM | POA: Diagnosis not present

## 2023-05-01 DIAGNOSIS — E1159 Type 2 diabetes mellitus with other circulatory complications: Secondary | ICD-10-CM | POA: Diagnosis not present

## 2023-05-01 DIAGNOSIS — E559 Vitamin D deficiency, unspecified: Secondary | ICD-10-CM | POA: Diagnosis not present

## 2023-05-01 DIAGNOSIS — E039 Hypothyroidism, unspecified: Secondary | ICD-10-CM | POA: Diagnosis not present

## 2023-07-03 DIAGNOSIS — Z9181 History of falling: Secondary | ICD-10-CM | POA: Diagnosis not present

## 2023-07-03 DIAGNOSIS — I872 Venous insufficiency (chronic) (peripheral): Secondary | ICD-10-CM | POA: Diagnosis not present

## 2023-07-03 DIAGNOSIS — S93401A Sprain of unspecified ligament of right ankle, initial encounter: Secondary | ICD-10-CM | POA: Diagnosis not present

## 2023-07-03 DIAGNOSIS — S0990XA Unspecified injury of head, initial encounter: Secondary | ICD-10-CM | POA: Diagnosis not present

## 2023-07-03 DIAGNOSIS — S99911A Unspecified injury of right ankle, initial encounter: Secondary | ICD-10-CM | POA: Diagnosis not present

## 2023-07-05 DIAGNOSIS — S82454A Nondisplaced comminuted fracture of shaft of right fibula, initial encounter for closed fracture: Secondary | ICD-10-CM | POA: Diagnosis not present

## 2023-07-25 DIAGNOSIS — I10 Essential (primary) hypertension: Secondary | ICD-10-CM | POA: Diagnosis not present

## 2023-07-25 DIAGNOSIS — M25571 Pain in right ankle and joints of right foot: Secondary | ICD-10-CM | POA: Diagnosis not present

## 2023-07-25 DIAGNOSIS — I87331 Chronic venous hypertension (idiopathic) with ulcer and inflammation of right lower extremity: Secondary | ICD-10-CM | POA: Diagnosis not present

## 2023-07-25 DIAGNOSIS — S82831P Other fracture of upper and lower end of right fibula, subsequent encounter for closed fracture with malunion: Secondary | ICD-10-CM | POA: Diagnosis not present

## 2023-07-30 DIAGNOSIS — E1159 Type 2 diabetes mellitus with other circulatory complications: Secondary | ICD-10-CM | POA: Diagnosis not present

## 2023-07-30 DIAGNOSIS — I10 Essential (primary) hypertension: Secondary | ICD-10-CM | POA: Diagnosis not present

## 2023-07-30 DIAGNOSIS — Z794 Long term (current) use of insulin: Secondary | ICD-10-CM | POA: Diagnosis not present

## 2023-07-30 DIAGNOSIS — I2510 Atherosclerotic heart disease of native coronary artery without angina pectoris: Secondary | ICD-10-CM | POA: Diagnosis not present

## 2023-08-09 DIAGNOSIS — E039 Hypothyroidism, unspecified: Secondary | ICD-10-CM | POA: Diagnosis not present

## 2023-08-09 DIAGNOSIS — I10 Essential (primary) hypertension: Secondary | ICD-10-CM | POA: Diagnosis not present

## 2023-08-09 DIAGNOSIS — Z794 Long term (current) use of insulin: Secondary | ICD-10-CM | POA: Diagnosis not present

## 2023-08-09 DIAGNOSIS — E559 Vitamin D deficiency, unspecified: Secondary | ICD-10-CM | POA: Diagnosis not present

## 2023-08-09 DIAGNOSIS — E785 Hyperlipidemia, unspecified: Secondary | ICD-10-CM | POA: Diagnosis not present

## 2023-08-09 DIAGNOSIS — E1142 Type 2 diabetes mellitus with diabetic polyneuropathy: Secondary | ICD-10-CM | POA: Diagnosis not present

## 2023-08-09 DIAGNOSIS — I6523 Occlusion and stenosis of bilateral carotid arteries: Secondary | ICD-10-CM | POA: Diagnosis not present

## 2023-08-09 DIAGNOSIS — I350 Nonrheumatic aortic (valve) stenosis: Secondary | ICD-10-CM | POA: Diagnosis not present

## 2023-08-09 DIAGNOSIS — I2510 Atherosclerotic heart disease of native coronary artery without angina pectoris: Secondary | ICD-10-CM | POA: Diagnosis not present

## 2023-08-09 DIAGNOSIS — E1159 Type 2 diabetes mellitus with other circulatory complications: Secondary | ICD-10-CM | POA: Diagnosis not present

## 2023-09-06 DIAGNOSIS — E1159 Type 2 diabetes mellitus with other circulatory complications: Secondary | ICD-10-CM | POA: Diagnosis not present

## 2023-09-06 DIAGNOSIS — S82831P Other fracture of upper and lower end of right fibula, subsequent encounter for closed fracture with malunion: Secondary | ICD-10-CM | POA: Diagnosis not present

## 2023-09-06 DIAGNOSIS — Z794 Long term (current) use of insulin: Secondary | ICD-10-CM | POA: Diagnosis not present

## 2023-09-06 DIAGNOSIS — M25571 Pain in right ankle and joints of right foot: Secondary | ICD-10-CM | POA: Diagnosis not present

## 2023-09-06 DIAGNOSIS — I10 Essential (primary) hypertension: Secondary | ICD-10-CM | POA: Diagnosis not present

## 2023-10-29 DIAGNOSIS — I2510 Atherosclerotic heart disease of native coronary artery without angina pectoris: Secondary | ICD-10-CM | POA: Diagnosis not present

## 2023-10-29 DIAGNOSIS — I10 Essential (primary) hypertension: Secondary | ICD-10-CM | POA: Diagnosis not present

## 2023-10-29 DIAGNOSIS — E1159 Type 2 diabetes mellitus with other circulatory complications: Secondary | ICD-10-CM | POA: Diagnosis not present

## 2023-10-29 DIAGNOSIS — S82831A Other fracture of upper and lower end of right fibula, initial encounter for closed fracture: Secondary | ICD-10-CM | POA: Diagnosis not present

## 2023-10-29 DIAGNOSIS — Z794 Long term (current) use of insulin: Secondary | ICD-10-CM | POA: Diagnosis not present

## 2023-12-11 ENCOUNTER — Other Ambulatory Visit: Payer: Self-pay

## 2023-12-11 DIAGNOSIS — Z1212 Encounter for screening for malignant neoplasm of rectum: Secondary | ICD-10-CM | POA: Diagnosis not present

## 2023-12-11 DIAGNOSIS — Z1339 Encounter for screening examination for other mental health and behavioral disorders: Secondary | ICD-10-CM | POA: Diagnosis not present

## 2023-12-11 DIAGNOSIS — E1142 Type 2 diabetes mellitus with diabetic polyneuropathy: Secondary | ICD-10-CM | POA: Diagnosis not present

## 2023-12-11 DIAGNOSIS — E559 Vitamin D deficiency, unspecified: Secondary | ICD-10-CM | POA: Diagnosis not present

## 2023-12-11 DIAGNOSIS — I10 Essential (primary) hypertension: Secondary | ICD-10-CM | POA: Diagnosis not present

## 2023-12-11 DIAGNOSIS — I2510 Atherosclerotic heart disease of native coronary artery without angina pectoris: Secondary | ICD-10-CM | POA: Diagnosis not present

## 2023-12-11 DIAGNOSIS — E039 Hypothyroidism, unspecified: Secondary | ICD-10-CM | POA: Diagnosis not present

## 2023-12-11 DIAGNOSIS — E1159 Type 2 diabetes mellitus with other circulatory complications: Secondary | ICD-10-CM | POA: Diagnosis not present

## 2023-12-11 DIAGNOSIS — Z794 Long term (current) use of insulin: Secondary | ICD-10-CM | POA: Diagnosis not present

## 2023-12-11 DIAGNOSIS — Z0000 Encounter for general adult medical examination without abnormal findings: Secondary | ICD-10-CM | POA: Diagnosis not present

## 2023-12-11 DIAGNOSIS — I350 Nonrheumatic aortic (valve) stenosis: Secondary | ICD-10-CM | POA: Diagnosis not present

## 2023-12-11 DIAGNOSIS — Z1331 Encounter for screening for depression: Secondary | ICD-10-CM | POA: Diagnosis not present

## 2023-12-11 DIAGNOSIS — E785 Hyperlipidemia, unspecified: Secondary | ICD-10-CM | POA: Diagnosis not present

## 2023-12-11 NOTE — Telephone Encounter (Signed)
 Opened in error, please disregard.

## 2024-03-17 DIAGNOSIS — I2510 Atherosclerotic heart disease of native coronary artery without angina pectoris: Secondary | ICD-10-CM | POA: Diagnosis not present

## 2024-03-17 DIAGNOSIS — E039 Hypothyroidism, unspecified: Secondary | ICD-10-CM | POA: Diagnosis not present

## 2024-03-17 DIAGNOSIS — E1159 Type 2 diabetes mellitus with other circulatory complications: Secondary | ICD-10-CM | POA: Diagnosis not present

## 2024-03-17 DIAGNOSIS — Z794 Long term (current) use of insulin: Secondary | ICD-10-CM | POA: Diagnosis not present

## 2024-03-17 DIAGNOSIS — I10 Essential (primary) hypertension: Secondary | ICD-10-CM | POA: Diagnosis not present

## 2024-05-11 DIAGNOSIS — E1159 Type 2 diabetes mellitus with other circulatory complications: Secondary | ICD-10-CM | POA: Diagnosis not present

## 2024-05-11 DIAGNOSIS — E1142 Type 2 diabetes mellitus with diabetic polyneuropathy: Secondary | ICD-10-CM | POA: Diagnosis not present

## 2024-05-11 DIAGNOSIS — I10 Essential (primary) hypertension: Secondary | ICD-10-CM | POA: Diagnosis not present

## 2024-05-11 DIAGNOSIS — E039 Hypothyroidism, unspecified: Secondary | ICD-10-CM | POA: Diagnosis not present

## 2024-05-11 DIAGNOSIS — I87331 Chronic venous hypertension (idiopathic) with ulcer and inflammation of right lower extremity: Secondary | ICD-10-CM | POA: Diagnosis not present

## 2024-05-11 DIAGNOSIS — I6523 Occlusion and stenosis of bilateral carotid arteries: Secondary | ICD-10-CM | POA: Diagnosis not present

## 2024-05-11 DIAGNOSIS — E785 Hyperlipidemia, unspecified: Secondary | ICD-10-CM | POA: Diagnosis not present

## 2024-05-11 DIAGNOSIS — I872 Venous insufficiency (chronic) (peripheral): Secondary | ICD-10-CM | POA: Diagnosis not present

## 2024-05-11 DIAGNOSIS — Z794 Long term (current) use of insulin: Secondary | ICD-10-CM | POA: Diagnosis not present

## 2024-05-11 DIAGNOSIS — I350 Nonrheumatic aortic (valve) stenosis: Secondary | ICD-10-CM | POA: Diagnosis not present

## 2024-05-11 DIAGNOSIS — I2510 Atherosclerotic heart disease of native coronary artery without angina pectoris: Secondary | ICD-10-CM | POA: Diagnosis not present

## 2024-06-08 ENCOUNTER — Telehealth: Payer: Self-pay

## 2024-06-08 DIAGNOSIS — I350 Nonrheumatic aortic (valve) stenosis: Secondary | ICD-10-CM

## 2024-06-08 NOTE — Telephone Encounter (Signed)
 The patient called into the office to arrange follow up with Dr Wendel.  The patient has noticed that she is SOB with activities and when bending over for the past week.  I have booked her for a follow up echocardiogram and office visit and reviewed ER precautions. Pt agreed with plan and verbalized understanding.

## 2024-06-11 NOTE — H&P (View-Only) (Signed)
 Patient ID: DESHAUN SCHOU MRN: [id number] DOB/AGE: 1946-06-19 78 y.o.  Primary Care Physician:South, Garnette, MD Primary Cardiologist: Shlomo  CC:  Aortic valvular disease management     FOCUSED PROBLEM LIST:   Aortic stenosis AVA 0.44, MG 55, V-max 4.4, EF 60 to 65% TTE May 2024 AVA 0.46, MG 41, V-max 4.1, EF 40-45%, TTE October 2025 EKG sinus rhythm without conduction abnormalities CAD 50% RPDA; coronary angiography 2023 T2DM On insulin  Hypertension Hyperlipidemia CKD stage II BMI 28/BSA 1.7  HISTORY OF PRESENT ILLNESS:   February 2023: When I saw her last she was referred for cardiac catheterization after stress test demonstrated some EKG changes.  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  She was asymptomatic from a dyspnea or anginal standpoint so no further interventions were pursued at that time.  April 2023: The patient continues to do very well.  She denies any exertional dyspnea, exertional angina, presyncope or syncope.  She is doing all of her activities of daily living without any limitations.  She denies any fatigue.  She has required no unplanned hospitalizations or emergency room visits.  She recently had an outpatient eye procedure done.  She is otherwise well and without complaints.  Plan: Given lack of significant symptoms follow-up in 3 months with echocardiogram.  May 2024: The patient continues to do well.  She denies any presyncope, syncope, chest pain, increasing fatigue, or exertional dyspnea.  She tells me she is busier now than she was before.  She fortunately has not required any emergency room visits or hospitalizations.  She is otherwise well and without significant complaints today.  Plan: Follow-up in 3 months  October 2025:  Patient consents to use of AI scribe. Patient reached out to our office with reports of increasing shortness of breath and she was scheduled for follow-up.  Over the past few  weeks, she has experienced a significant increase in symptoms, including severe shortness of breath with minimal exertion, such as walking short distances or bending down. She frequently needs to sit down to catch her breath, and her symptoms have considerably slowed her down.  She reports swelling in her ankles and feet, attributing it to water retention, as she has gained 10-12 pounds recently without changes in her eating habits. She recalls an episode at a grocery store where she felt faint and had difficulty breathing, requiring her granddaughter to drive her home. It took about two hours for her breathing to return to normal after resting.  No chest pain, tightness, or palpitations are present, but she mentions difficulty breathing when lying on her back, which resolves when she lies on her side. She has not experienced any recent lightheadedness when changing positions, although she used to feel dizzy occasionally when lying down at night.  Her past medical history includes aortic valve stenosis. She is currently taking a baby aspirin daily.  She expresses concern about starting a blood thinner due to frequent cuts and scratches from her pets, which include ten cats and several dogs. She worries about the risk of bleeding due to these minor injuries.          Past Medical History:  Diagnosis Date   Aortic stenosis    Bilateral hearing loss    wears hearing aids   Candida albicans infection    Carotid stenosis    Cellulitis of right lower extremity without foot    Chronic venous hypertension (idiopathic) with ulcer and inflammation of right lower extremity (  CODE)    Diabetes mellitus without complication (HCC)    Type 2, FreeStyle Libre-sensor located on upper left arm   Diabetic neuropathy (HCC)    Diabetic polyneuropathy (HCC)    Goiter    Hyperlipidemia    Hypertension    pt denies this dx as of 12/13/21   Hyperthyroidism    Hypothyroidism    LVH (left ventricular  hypertrophy)    Nonrheumatic aortic valve stenosis    Scratches    CAT   Sleep related leg cramps    Tendon pain    Ulcer of left lower extremity (HCC)    Ulcer of right lower extremity (HCC)    Unspecified abnormalities of gait and mobility    Unsteady gait    Vasculitis    Vitamin D  deficiency    Wears dentures    full    Past Surgical History:  Procedure Laterality Date   CATARACT EXTRACTION Bilateral    HERNIA REPAIR     KENALOG  INJECTION Right 12/14/2021   Procedure: AVASTIN  INJECTION;  Surgeon: Jarold Mayo, MD;  Location: [facility] OR;  Service: Ophthalmology;  Laterality: Right;   PARS PLANA VITRECTOMY Right 12/14/2021   Procedure: PARS PLANA VITRECTOMY WITH 25 GAUGE;  Surgeon: Jarold Mayo, MD;  Location: [facility] OR;  Service: Ophthalmology;  Laterality: Right;   PHOTOCOAGULATION WITH LASER Right 12/14/2021   Procedure: PHOTOCOAGULATION WITH LASER;  Surgeon: Jarold Mayo, MD;  Location: [facility] OR;  Service: Ophthalmology;  Laterality: Right;   RIGHT/LEFT HEART CATH AND CORONARY ANGIOGRAPHY N/A 12/11/2021   Procedure: RIGHT/LEFT HEART CATH AND CORONARY ANGIOGRAPHY;  Surgeon: Wendel Lurena POUR, MD;  Location: [facility] IN[facility] LAB;  Service: Cardiovascular;  Laterality: N/A;    Family History  Problem Relation Age of Onset   Thyroid  cancer Mother    CAD Mother 32       58   Diabetes Father     Social History   Socioeconomic History   Marital status: Single    Spouse name: Not on file   Number of children: Not on file   Years of education: Not on file   Highest education level: Not on file  Occupational History   Not on file  Tobacco Use   Smoking status: Never   Smokeless tobacco: Never  Vaping Use   Vaping status: Never Used  Substance and Sexual Activity   Alcohol  use: Never   Drug use: Never   Sexual activity: Not Currently    Birth control/protection: Post-menopausal  Other Topics Concern   Not on file  Social History Narrative   Not on file   Social Drivers of  Health   Financial Resource Strain: Not on file  Food Insecurity: Not on file  Transportation Needs: Not on file  Physical Activity: Not on file  Stress: Not on file  Social Connections: Not on file  Intimate Partner Violence: Not on file     Prior to Admission medications   Medication Sig Start Date End Date Taking? Authorizing Provider  Continuous Blood Gluc Sensor (FREESTYLE LIBRE 2 SENSOR) MISC CHANGE EVERY 14 DAYS TO MONITOR BLOOD GLUCOSE CONTINUOUSLY 11/23/21   [provider]  Continuous Blood Gluc Sensor (FREESTYLE LIBRE 2 SENSOR) MISC CHANGE EVERY 14 DAYS TO MONITOR BLOOD GLUCOSE CONTINUOUSLY    [provider]  Cyanocobalamin (VITAMIN B12) 1000 MCG TBCR Take 1,000 mcg by mouth daily.    [provider]  insulin  glargine, 2 Unit Dial, (TOUJEO MAX SOLOSTAR) 300 UNIT/ML Solostar Pen Inject 22 Units  into the skin at bedtime.    [provider]  levothyroxine (SYNTHROID) 125 MCG tablet Take 125 mcg by mouth See admin instructions. Take every day except Sunday    [provider]  NOVOLOG  FLEXPEN 100 UNIT/ML FlexPen 9 Units 3 (three) times daily with meals. 11/23/21   [provider]  prednisoLONE acetate (PRED FORTE) 1 % ophthalmic suspension Place 1 drop into the right eye 4 (four) times daily. Patient not taking: Reported on 01/21/2023 11/03/21   [provider]  Vitamin D , Ergocalciferol , (DRISDOL) 1.25 MG (50000 UNIT) CAPS capsule Take 50,000 Units by mouth every 7 (seven) days.    [provider]    Allergies  Allergen Reactions   Adhesive [Tape] Rash    Reaction to plastic bandages - fabric bandages ok Paper Tape is okay to use.   Cortisone Rash    Topical cortisone    REVIEW OF SYSTEMS:  General: no fevers/chills/night sweats Eyes: no blurry vision, diplopia, or amaurosis ENT: no sore throat or hearing loss Resp: no cough, wheezing, or hemoptysis CV: no edema or palpitations GI: no abdominal pain,  nausea, vomiting, diarrhea, or constipation GU: no dysuria, frequency, or hematuria Skin: no rash Neuro: no headache, numbness, tingling, or weakness of extremities Musculoskeletal: no joint pain or swelling Heme: no bleeding, DVT, or easy bruising Endo: no polydipsia or polyuria  BP 134/76   Pulse 91   Ht 5' 1 (1.549 m)   Wt 144 lb 12.8 oz (65.7 kg)   SpO2 98%   BMI 27.36 kg/m   PHYSICAL EXAM: GEN:  AO x 3 in no acute distress HEENT: normal Dentition: Upper and lower dentures Neck: JVP normal. +2 carotid upstrokes without bruits. No thyromegaly. Lungs: equal expansion, clear bilaterally CV: Apex is discrete and nondisplaced, irregular rate and rhythm with 3 out of 6 systolic ejection murmur Abd: soft, non-tender, non-distended; no bruit; positive bowel sounds Ext: +1 edema, no ecchymoses, or cyanosis Vascular: 2+ femoral pulses, 2+ radial pulses       Skin: warm and dry without rash Neuro: CN II-XII grossly intact; motor and sensory grossly intact    DATA AND STUDIES:  EKG:  EKG Interpretation Date/Time:  Thursday June 18 2024 11:29:50 EDT Ventricular Rate:  96 PR Interval:    QRS Duration:  104 QT Interval:  354 QTC Calculation: 447 R Axis:   69  Text Interpretation: Atrial fibrillation Septal infarct , age undetermined ST & T wave abnormality, consider inferolateral ischemia No previous ECGs available Confirmed by Wendel Haws ([id number]) on 06/18/2024 11:41:09 AM        CARDIAC STUDIES: Refer to CV Procedures and Imaging Tabs  No results found for requested labs within last 365 days.   STS RISK CALCULATOR: Pending  NYHA CLASS: 2    ASSESSMENT AND PLAN:   1. Nonrheumatic aortic valve stenosis   2. Coronary artery disease involving native coronary artery of native heart without angina pectoris   3. Type 2 diabetes mellitus with complication, with long-term current use of insulin  (HCC)   4. Hypertension associated with diabetes (HCC)   5.  Hyperlipidemia associated with type 2 diabetes mellitus (HCC)   6. CKD stage 2 due to type 2 diabetes mellitus (HCC)   7. PAF (paroxysmal atrial fibrillation) (HCC)   8. Nonischemic cardiomyopathy (HCC)   9. Pre-procedure lab exam     Aortic stenosis: The patient underwent an evaluation for aortic valve intervention in the past.  CTAs then suggested that a 20 mm  S3 valve or a 23 mm Medtronic fracture valve were feasible.  It looks like she has femoral access.  Her calcium score on this study was 1092.  The membranous septum length was not measured.  Her most recent echocardiogram demonstrates diminished LV function.  The patient endorses NYHA class II symptoms of shortness of breath.  Will refer for coronary angiography, CTA, and cardiothoracic surgical appointment. CAD: Mild to moderate disease on coronary angiography 2023.  Start Eliquis 5 mg twice daily due to new onset atrial fibrillation.  Start atorvastatin after aortic stenosis has been managed. T2DM: Start Eliquis 5 mg twice daily, start atorvastatin in the near future, consider Jardiance 10 mg and ARB in future. Hypertension: BP mildly above goal; will optimize regimen with addition of ARB after aortic stenosis has been managed. Hyperlipidemia: Start atorvastatin in the near future.   CKD stage II: Consider Jardiance and ARB after aortic stenosis has been managed.  Paroxysmal atrial fibrillation: Start Eliquis 5 mg twice daily, Lasix 20 mg twice daily.  Start Toprol 25 mg check BMP next week with preprocedural labs. Nonischemic cardiomyopathy: Start Toprol 25 mg, Lasix 20 mg twice daily.  Will optimize regimen after aortic stenosis has been managed.   I have personally reviewed the patients imaging data as summarized above.  I have reviewed the natural history of aortic stenosis with the patient and family members who are present today. We have discussed the limitations of medical therapy and the poor prognosis associated with symptomatic  aortic stenosis. We have also reviewed potential treatment options, including palliative medical therapy, conventional surgical aortic valve replacement, and transcatheter aortic valve replacement. We discussed treatment options in the context of this patient's specific comorbid medical conditions.   All of the patient's questions were answered today. Will make further recommendations based on the results of studies outlined above.   I spent 42 minutes reviewing all clinical data during and prior to this visit including all relevant imaging studies, laboratories, clinical information from other health systems and prior notes from both Cardiology and other specialties, interviewing the patient, conducting a complete physical examination, and coordinating care in order to formulate a comprehensive and personalized evaluation and treatment plan.   Braidyn Scorsone K Marlon Suleiman, MD  06/18/2024 1:33 PM    [facility] [address] [place], [place], [place]  [address] Phone: [phone]; Fax: [phone]

## 2024-06-11 NOTE — Progress Notes (Unsigned)
 Patient ID: Sarah Shelton MRN: [id number] DOB/AGE: 79-May-1947 79 y.o.  Primary Care Physician:South, Garnette, MD Primary Cardiologist: Shlomo  CC:  Aortic valvular disease management     FOCUSED PROBLEM LIST:   Aortic stenosis AVA 0.44, MG 55, V-max 4.4, EF 60 to 65% TTE May 2024 EKG sinus rhythm without conduction abnormalities T2DM On insulin  Hypertension Hyperlipidemia CKD stage II BMI 28/BSA 1.7  HISTORY OF PRESENT ILLNESS: The patient is a 78 y.o. female with the indicated medical history here for 7-month follow-up.    February 2023: When I saw her last she was referred for cardiac catheterization after stress test demonstrated some EKG changes.  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  She was asymptomatic from a dyspnea or anginal standpoint so no further interventions were pursued at that time.  April 2023: The patient continues to do very well.  She denies any exertional dyspnea, exertional angina, presyncope or syncope.  She is doing all of her activities of daily living without any limitations.  She denies any fatigue.  She has required no unplanned hospitalizations or emergency room visits.  She recently had an outpatient eye procedure done.  She is otherwise well and without complaints.  Plan: Given lack of significant symptoms follow-up in 3 months with echocardiogram.  May 2024: The patient continues to do well.  She denies any presyncope, syncope, chest pain, increasing fatigue, or exertional dyspnea.  She tells me she is busier now than she was before.  She fortunately has not required any emergency room visits or hospitalizations.  She is otherwise well and without significant complaints today.  Plan: Follow-up in 3 months  October 2025:  Patient consents to use of AI scribe. Patient reached out to our office with reports of increasing shortness of breath and she was scheduled for follow-up.          Past  Medical History:  Diagnosis Date   Aortic stenosis    Bilateral hearing loss    wears hearing aids   Candida albicans infection    Carotid stenosis    Cellulitis of right lower extremity without foot    Chronic venous hypertension (idiopathic) with ulcer and inflammation of right lower extremity (CODE)    Diabetes mellitus without complication (HCC)    Type 2, FreeStyle Libre-sensor located on upper left arm   Diabetic neuropathy (HCC)    Diabetic polyneuropathy (HCC)    Goiter    Hyperlipidemia    Hypertension    pt denies this dx as of 12/13/21   Hyperthyroidism    Hypothyroidism    LVH (left ventricular hypertrophy)    Nonrheumatic aortic valve stenosis    Scratches    CAT   Sleep related leg cramps    Tendon pain    Ulcer of left lower extremity (HCC)    Ulcer of right lower extremity (HCC)    Unspecified abnormalities of gait and mobility    Unsteady gait    Vasculitis    Vitamin D  deficiency    Wears dentures    full    Past Surgical History:  Procedure Laterality Date   CATARACT EXTRACTION Bilateral    HERNIA REPAIR     KENALOG  INJECTION Right 12/14/2021   Procedure: AVASTIN  INJECTION;  Surgeon: Jarold Mayo, MD;  Location: [facility] OR;  Service: Ophthalmology;  Laterality: Right;   PARS PLANA VITRECTOMY Right 12/14/2021   Procedure: PARS PLANA VITRECTOMY WITH 25 GAUGE;  Surgeon: Jarold Mayo, MD;  Location: [facility] OR;  Service: Ophthalmology;  Laterality: Right;   PHOTOCOAGULATION WITH LASER Right 12/14/2021   Procedure: PHOTOCOAGULATION WITH LASER;  Surgeon: Jarold Mayo, MD;  Location: [facility] OR;  Service: Ophthalmology;  Laterality: Right;   RIGHT/LEFT HEART CATH AND CORONARY ANGIOGRAPHY N/A 12/11/2021   Procedure: RIGHT/LEFT HEART CATH AND CORONARY ANGIOGRAPHY;  Surgeon: Wendel Lurena POUR, MD;  Location: [facility];  Service: Cardiovascular;  Laterality: N/A;    Family History  Problem Relation Age of Onset   Thyroid  cancer Mother    CAD Mother 88       36    Diabetes Father     Social History   Socioeconomic History   Marital status: Single    Spouse name: Not on file   Number of children: Not on file   Years of education: Not on file   Highest education level: Not on file  Occupational History   Not on file  Tobacco Use   Smoking status: Never   Smokeless tobacco: Never  Vaping Use   Vaping status: Never Used  Substance and Sexual Activity   Alcohol  use: Never   Drug use: Never   Sexual activity: Not Currently    Birth control/protection: Post-menopausal  Other Topics Concern   Not on file  Social History Narrative   Not on file   Social Drivers of Health   Financial Resource Strain: Not on file  Food Insecurity: Not on file  Transportation Needs: Not on file  Physical Activity: Not on file  Stress: Not on file  Social Connections: Not on file  Intimate Partner Violence: Not on file     Prior to Admission medications   Medication Sig Start Date End Date Taking? Authorizing Provider  Continuous Blood Gluc Sensor (FREESTYLE LIBRE 2 SENSOR) MISC CHANGE EVERY 14 DAYS TO MONITOR BLOOD GLUCOSE CONTINUOUSLY 11/23/21   [provider]  Continuous Blood Gluc Sensor (FREESTYLE LIBRE 2 SENSOR) MISC CHANGE EVERY 14 DAYS TO MONITOR BLOOD GLUCOSE CONTINUOUSLY    [provider]  Cyanocobalamin (VITAMIN B12) 1000 MCG TBCR Take 1,000 mcg by mouth daily.    [provider]  insulin  glargine, 2 Unit Dial, (TOUJEO MAX SOLOSTAR) 300 UNIT/ML Solostar Pen Inject 22 Units into the skin at bedtime.    [provider]  levothyroxine (SYNTHROID) 125 MCG tablet Take 125 mcg by mouth See admin instructions. Take every day except Sunday    [provider]  NOVOLOG  FLEXPEN 100 UNIT/ML FlexPen 9 Units 3 (three) times daily with meals. 11/23/21   [provider]  prednisoLONE acetate (PRED FORTE) 1 % ophthalmic suspension Place 1 drop into the right eye 4 (four) times daily. Patient not taking:  Reported on 01/21/2023 11/03/21   [provider]  Vitamin D , Ergocalciferol , (DRISDOL) 1.25 MG (50000 UNIT) CAPS capsule Take 50,000 Units by mouth every 7 (seven) days.    [provider]    Allergies  Allergen Reactions   Adhesive [Tape] Rash    Reaction to plastic bandages - fabric bandages ok Paper Tape is okay to use.   Cortisone Rash    Topical cortisone    REVIEW OF SYSTEMS:  General: no fevers/chills/night sweats Eyes: no blurry vision, diplopia, or amaurosis ENT: no sore throat or hearing loss Resp: no cough, wheezing, or hemoptysis CV: no edema or palpitations GI: no abdominal pain, nausea, vomiting, diarrhea, or constipation GU: no dysuria, frequency, or hematuria Skin: no rash Neuro: no headache, numbness, tingling, or weakness of extremities  Musculoskeletal: no joint pain or swelling Heme: no bleeding, DVT, or easy bruising Endo: no polydipsia or polyuria  There were no vitals taken for this visit.  PHYSICAL EXAM: GEN:  AO x 3 in no acute distress HEENT: normal Dentition: Normal*** Neck: JVP normal. +2***carotid upstrokes without bruits. No thyromegaly. Lungs: equal expansion, clear bilaterally CV: Apex is discrete and nondisplaced, RRR without murmur or gallop*** Abd: soft, non-tender, non-distended; no bruit; positive bowel sounds Ext: no edema, ecchymoses, or cyanosis Vascular: 2+ femoral pulses, 2+ radial pulses       Skin: warm and dry without rash Neuro: CN II-XII grossly intact; motor and sensory grossly intact    DATA AND STUDIES:  EKG:  EKG Interpretation Date/Time:    Ventricular Rate:    PR Interval:    QRS Duration:    QT Interval:    QTC Calculation:   R Axis:      Text Interpretation:          CARDIAC STUDIES: Refer to CV Procedures and Imaging Tabs  No results found for requested labs within last 365 days.   STS RISK CALCULATOR: Pending  NYHA CLASS: ***    ASSESSMENT AND PLAN:   1. Nonrheumatic  aortic valve stenosis   2. Type 2 diabetes mellitus with complication, with long-term current use of insulin  (HCC)   3. Hypertension associated with diabetes (HCC)   4. Hyperlipidemia associated with type 2 diabetes mellitus (HCC)     Aortic stenosis:*** T2DM: Start aspirin 81 mg, start atorvastatin 40 mg, consider Jardiance 10 mg, ARB?*** Hypertension: ARB?*** Hyperlipidemia: Start atorvastatin 40 mg will check lipid panel, LFTs, LP(a) in 2 months   I have personally reviewed the patients imaging data as summarized above.  I have reviewed the natural history of aortic stenosis with the patient and family members who are present today. We have discussed the limitations of medical therapy and the poor prognosis associated with symptomatic aortic stenosis. We have also reviewed potential treatment options, including palliative medical therapy, conventional surgical aortic valve replacement, and transcatheter aortic valve replacement. We discussed treatment options in the context of this patient's specific comorbid medical conditions.   All of the patient's questions were answered today. Will make further recommendations based on the results of studies outlined above.   I spent *** minutes reviewing all clinical data during and prior to this visit including all relevant imaging studies, laboratories, clinical information from other health systems and prior notes from both Cardiology and other specialties, interviewing the patient, conducting a complete physical examination, and coordinating care in order to formulate a comprehensive and personalized evaluation and treatment plan.   Sarah Tanksley K Hadyn Azer, MD  06/11/2024 6:57 PM    [facility] [address] [place], [place], [place]  [address] Phone: [phone]; Fax: [phone]

## 2024-06-12 ENCOUNTER — Ambulatory Visit (HOSPITAL_COMMUNITY)
Admission: RE | Admit: 2024-06-12 | Discharge: 2024-06-12 | Disposition: A | Discharge status: Home / Self Care | Source: Ambulatory Visit | Attending: Cardiology | Admitting: Cardiology

## 2024-06-12 DIAGNOSIS — I350 Nonrheumatic aortic (valve) stenosis: Secondary | ICD-10-CM | POA: Diagnosis not present

## 2024-06-12 LAB — ECHOCARDIOGRAM COMPLETE
AR max vel: 0.41 cm2
AV Area VTI: 0.46 cm2
AV Area mean vel: 0.44 cm2
AV Mean grad: 41 mmHg
AV Peak grad: 68.2 mmHg
Ao pk vel: 4.13 m/s
Area-P 1/2: 2.82 cm2
P 1/2 time: 408 ms
S' Lateral: 3.2 cm

## 2024-06-14 ENCOUNTER — Ambulatory Visit: Payer: Self-pay | Admitting: Internal Medicine

## 2024-06-18 ENCOUNTER — Ambulatory Visit: Attending: Internal Medicine | Admitting: Internal Medicine

## 2024-06-18 ENCOUNTER — Encounter: Payer: Self-pay | Admitting: Internal Medicine

## 2024-06-18 ENCOUNTER — Other Ambulatory Visit (HOSPITAL_COMMUNITY): Payer: Self-pay

## 2024-06-18 VITALS — BP 134/76 | HR 91 | Ht 61.0 in | Wt 144.8 lb

## 2024-06-18 DIAGNOSIS — I152 Hypertension secondary to endocrine disorders: Secondary | ICD-10-CM | POA: Insufficient documentation

## 2024-06-18 DIAGNOSIS — E1169 Type 2 diabetes mellitus with other specified complication: Secondary | ICD-10-CM | POA: Diagnosis not present

## 2024-06-18 DIAGNOSIS — Z01812 Encounter for preprocedural laboratory examination: Secondary | ICD-10-CM | POA: Insufficient documentation

## 2024-06-18 DIAGNOSIS — E1122 Type 2 diabetes mellitus with diabetic chronic kidney disease: Secondary | ICD-10-CM | POA: Diagnosis not present

## 2024-06-18 DIAGNOSIS — E785 Hyperlipidemia, unspecified: Secondary | ICD-10-CM | POA: Diagnosis not present

## 2024-06-18 DIAGNOSIS — I2510 Atherosclerotic heart disease of native coronary artery without angina pectoris: Secondary | ICD-10-CM | POA: Insufficient documentation

## 2024-06-18 DIAGNOSIS — I428 Other cardiomyopathies: Secondary | ICD-10-CM | POA: Insufficient documentation

## 2024-06-18 DIAGNOSIS — I350 Nonrheumatic aortic (valve) stenosis: Secondary | ICD-10-CM | POA: Diagnosis not present

## 2024-06-18 DIAGNOSIS — I480 Paroxysmal atrial fibrillation: Secondary | ICD-10-CM | POA: Insufficient documentation

## 2024-06-18 DIAGNOSIS — E1159 Type 2 diabetes mellitus with other circulatory complications: Secondary | ICD-10-CM | POA: Diagnosis not present

## 2024-06-18 DIAGNOSIS — N182 Chronic kidney disease, stage 2 (mild): Secondary | ICD-10-CM | POA: Diagnosis not present

## 2024-06-18 DIAGNOSIS — E118 Type 2 diabetes mellitus with unspecified complications: Secondary | ICD-10-CM | POA: Insufficient documentation

## 2024-06-18 DIAGNOSIS — Z794 Long term (current) use of insulin: Secondary | ICD-10-CM | POA: Diagnosis not present

## 2024-06-18 MED ORDER — APIXABAN 5 MG PO TABS
5.0000 mg | ORAL_TABLET | Freq: Two times a day (BID) | ORAL | 0 refills | Status: DC
  Filled 2024-06-18: qty 60, 30d supply, fill #0

## 2024-06-18 MED ORDER — FUROSEMIDE 20 MG PO TABS
20.0000 mg | ORAL_TABLET | Freq: Two times a day (BID) | ORAL | 3 refills | Status: DC
  Filled 2024-06-18: qty 60, 30d supply, fill #0
  Filled 2024-07-27: qty 60, 30d supply, fill #1

## 2024-06-18 MED ORDER — METOPROLOL SUCCINATE ER 25 MG PO TB24
25.0000 mg | ORAL_TABLET | Freq: Every day | ORAL | 3 refills | Status: DC
  Filled 2024-06-18: qty 30, 30d supply, fill #0

## 2024-06-18 NOTE — Patient Instructions (Addendum)
 Medication Instructions:  START Eliquis 5 mg twice daily   START Furosemide (Lasix) 20 mg twice daily  START Metoprolol Succinate (Toprol-XL) 25 mg once daily at bedtime  *If you need a refill on your cardiac medications before your next appointment, please call your pharmacy*  Lab Work: To be completed Wednesday June 24, 2024: BMP and CBC  If you have labs (blood work) drawn today and your tests are completely normal, you will receive your results only by: MyChart Message (if you have MyChart) OR A paper copy in the mail If you have any lab test that is abnormal or we need to change your treatment, we will call you to review the results.  Testing/Procedures: Your physician has requested that you have a cardiac catheterization on 06/26/24 with Dr. Wendel. Cardiac catheterization is used to diagnose and/or treat various heart conditions. Doctors may recommend this procedure for a number of different reasons. The most common reason is to evaluate chest pain. Chest pain can be a symptom of coronary artery disease (CAD), and cardiac catheterization can show whether plaque is narrowing or blocking your heart's arteries. This procedure is also used to evaluate the valves, as well as measure the blood flow and oxygen levels in different parts of your heart. For further information please visit [email or web address] Please follow instruction sheet, as given.   Follow-Up: At [facility], you and your health needs are our priority.  As part of our continuing mission to provide you with exceptional heart care, our providers are all part of one team.  This team includes your primary Cardiologist (physician) and Advanced Practice Providers or APPs (Physician Assistants and Nurse Practitioners) who all work together to provide you with the care you need, when you need it.  Your next appointment:   Per Structural Heart Team  Provider:   Lurena Wendel, MD or [name], PA-C    We  recommend signing up for the patient portal called MyChart.  Sign up information is provided on this After Visit Summary.  MyChart is used to connect with patients for Virtual Visits (Telemedicine).  Patients are able to view lab/test results, encounter notes, upcoming appointments, etc.  Non-urgent messages can be sent to your provider as well.   To learn more about what you can do with MyChart, go to [email or web address].   Other Instructions       Cardiac/Peripheral Catheterization   You are scheduled for a Cardiac Catheterization on Friday, October 24 with Dr. Lurena Wendel.  1. Please arrive at the [facility] (Main Entrance A) at [facility]: [address] [place], [place] [address] at 7:00 AM (This time is 2 hour(s) before your procedure to ensure your preparation). Your procedure is scheduled to begin at 9 AM.  Free valet parking service is available. You will check in at ADMITTING. The support person will be asked to wait in the waiting room.  It is OK to have someone drop you off and come back when you are ready to be discharged.        Special note: Every effort is made to have your procedure done on time. Please understand that emergencies sometimes delay scheduled procedures.  2. Diet: Nothing to eat after midnight.  3. Hydration:You need to be well hydrated before your procedure. On October 24, you may drink approved liquids (see below) until 2 hours before the procedure, with 16 oz of water as your last intake.   List of approved liquids water, clear juice, clear  tea, black coffee, fruit juices, non-citric and without pulp, carbonated beverages, Gatorade, Kool -Aid, plain Jello-O and plain ice popsicles.  4. Labs: You will need to have blood drawn on Thursday, October 16 at [facility] (1st Floor), [address], [place], [place] [address]. You do not need to be fasting.  5. Medication instructions in preparation  for your procedure:   Contrast Allergy: No  Stop taking Eliquis (Apixiban) on Wednesday, October 22.   Take only 7 units of insulin  the night before your procedure. Do not take any insulin  on the day of the procedure.   On the morning of your procedure, take Aspirin 81 mg and any morning medicines NOT listed above.  You may use sips of water.  6. Plan to go home the same day, you will only stay overnight if medically necessary. 7. You MUST have a responsible adult to drive you home. 8. An adult MUST be with you the first 24 hours after you arrive home. 9. Bring a current list of your medications, and the last time and date medication taken. 10. Bring ID and current insurance cards. 11.Please wear clothes that are easy to get on and off and wear slip-on shoes.  Thank you for allowing us  to care for you!   -- [facility]

## 2024-06-18 NOTE — Progress Notes (Addendum)
 Pre Surgical Assessment: 5 M Walk Test  51M=16.75ft  5 Meter Walk Test- trial 1: 6.45 seconds 5 Meter Walk Test- trial 2: 6.08 seconds 5 Meter Walk Test- trial 3: 6.72 seconds 5 Meter Walk Test Average: 6.42 seconds  ______________________   Procedure Type: Isolated AVR Perioperative Outcome Estimate % Operative Mortality 6.51% Morbidity & Mortality 21.4% Stroke 2% Renal Failure 5.1% Reoperation 4.93% Prolonged Ventilation 16.3% Deep Sternal Wound Infection 0.083% Long Hospital Stay (>14 days) 16.9% Short Hospital Stay (<6 days)* 14.5%

## 2024-06-19 ENCOUNTER — Other Ambulatory Visit: Payer: Self-pay

## 2024-06-19 DIAGNOSIS — I350 Nonrheumatic aortic (valve) stenosis: Secondary | ICD-10-CM

## 2024-06-23 ENCOUNTER — Telehealth: Payer: Self-pay | Admitting: *Deleted

## 2024-06-23 NOTE — Telephone Encounter (Signed)
 Cardiac Catheterization scheduled at [facility] for: Friday June 26, 2024 9 AM Arrival time [facility] Main Entrance A at: 7 AM  Diet: -Nothing to eat after midnight.  Hydration: -May drink clear liquids until 2 hours before the procedure.  Approved liquids: Water, clear tea, black coffee, fruit juices-non-citric and without pulp,Gatorade, plain Jello/popsicles.   -Please drink 8 oz of water 2 hours before procedure.  Medication instructions: -Hold:  Eliquis-none 06/24/24 until post procedure   Lasix-AM of procedure  Insulin -AM of procedure 1/2 usual Insulin  HS prior to procedure -Other usual morning medications can be taken including aspirin 81 mg.  Plan to go home the same day, you will only stay overnight if medically necessary.  You must have responsible adult to drive you home.  Someone must be with you the first 24 hours after you arrive home.  Reviewed procedure instructions with patient.  BMP/CBC 06/24/24 [facility].

## 2024-06-24 DIAGNOSIS — Z01812 Encounter for preprocedural laboratory examination: Secondary | ICD-10-CM | POA: Diagnosis not present

## 2024-06-24 DIAGNOSIS — I350 Nonrheumatic aortic (valve) stenosis: Secondary | ICD-10-CM | POA: Diagnosis not present

## 2024-06-25 ENCOUNTER — Ambulatory Visit: Payer: Self-pay | Admitting: Internal Medicine

## 2024-06-25 LAB — CBC
Hematocrit: 35.4 % (ref 34.0–46.6)
Hemoglobin: 11.5 g/dL (ref 11.1–15.9)
MCH: 32.2 pg (ref 26.6–33.0)
MCHC: 32.5 g/dL (ref 31.5–35.7)
MCV: 99 fL — ABNORMAL HIGH (ref 79–97)
Platelets: 229 x10E3/uL (ref 150–450)
RBC: 3.57 x10E6/uL — ABNORMAL LOW (ref 3.77–5.28)
RDW: 12.1 % (ref 11.7–15.4)
WBC: 6 x10E3/uL (ref 3.4–10.8)

## 2024-06-25 LAB — BASIC METABOLIC PANEL WITH GFR
BUN/Creatinine Ratio: 23 (ref 12–28)
BUN: 24 mg/dL (ref 8–27)
CO2: 24 mmol/L (ref 20–29)
Calcium: 8.7 mg/dL (ref 8.7–10.3)
Chloride: 100 mmol/L (ref 96–106)
Creatinine, Ser: 1.06 mg/dL — ABNORMAL HIGH (ref 0.57–1.00)
Glucose: 121 mg/dL — ABNORMAL HIGH (ref 70–99)
Potassium: 3.8 mmol/L (ref 3.5–5.2)
Sodium: 139 mmol/L (ref 134–144)
eGFR: 53 mL/min/1.73 — ABNORMAL LOW (ref >59)

## 2024-06-26 ENCOUNTER — Other Ambulatory Visit: Payer: Self-pay

## 2024-06-26 ENCOUNTER — Ambulatory Visit (HOSPITAL_COMMUNITY)
Admission: RE | Admit: 2024-06-26 | Discharge: 2024-06-26 | Disposition: A | Discharge status: Home / Self Care | Attending: Internal Medicine | Admitting: Internal Medicine

## 2024-06-26 ENCOUNTER — Encounter (HOSPITAL_COMMUNITY)
Admission: RE | Disposition: A | Discharge status: Home / Self Care | Payer: Self-pay | Source: Home / Self Care | Attending: Internal Medicine

## 2024-06-26 DIAGNOSIS — E1122 Type 2 diabetes mellitus with diabetic chronic kidney disease: Secondary | ICD-10-CM | POA: Diagnosis not present

## 2024-06-26 DIAGNOSIS — I350 Nonrheumatic aortic (valve) stenosis: Secondary | ICD-10-CM | POA: Diagnosis not present

## 2024-06-26 DIAGNOSIS — I480 Paroxysmal atrial fibrillation: Secondary | ICD-10-CM | POA: Diagnosis not present

## 2024-06-26 DIAGNOSIS — I2510 Atherosclerotic heart disease of native coronary artery without angina pectoris: Secondary | ICD-10-CM | POA: Insufficient documentation

## 2024-06-26 DIAGNOSIS — Z79899 Other long term (current) drug therapy: Secondary | ICD-10-CM | POA: Diagnosis not present

## 2024-06-26 DIAGNOSIS — Z7982 Long term (current) use of aspirin: Secondary | ICD-10-CM | POA: Diagnosis not present

## 2024-06-26 DIAGNOSIS — E1159 Type 2 diabetes mellitus with other circulatory complications: Secondary | ICD-10-CM | POA: Insufficient documentation

## 2024-06-26 DIAGNOSIS — N182 Chronic kidney disease, stage 2 (mild): Secondary | ICD-10-CM | POA: Diagnosis not present

## 2024-06-26 DIAGNOSIS — E7849 Other hyperlipidemia: Secondary | ICD-10-CM | POA: Insufficient documentation

## 2024-06-26 DIAGNOSIS — I129 Hypertensive chronic kidney disease with stage 1 through stage 4 chronic kidney disease, or unspecified chronic kidney disease: Secondary | ICD-10-CM | POA: Diagnosis not present

## 2024-06-26 DIAGNOSIS — Z794 Long term (current) use of insulin: Secondary | ICD-10-CM | POA: Diagnosis not present

## 2024-06-26 DIAGNOSIS — I428 Other cardiomyopathies: Secondary | ICD-10-CM | POA: Diagnosis not present

## 2024-06-26 DIAGNOSIS — E1169 Type 2 diabetes mellitus with other specified complication: Secondary | ICD-10-CM | POA: Insufficient documentation

## 2024-06-26 HISTORY — PX: ABDOMINAL AORTOGRAM: CATH118222

## 2024-06-26 HISTORY — PX: RIGHT/LEFT HEART CATH AND CORONARY ANGIOGRAPHY: CATH118266

## 2024-06-26 LAB — POCT I-STAT EG7
Acid-Base Excess: 0 mmol/L (ref 0.0–2.0)
Acid-Base Excess: 0 mmol/L (ref 0.0–2.0)
Bicarbonate: 26.1 mmol/L (ref 20.0–28.0)
Bicarbonate: 26.1 mmol/L (ref 20.0–28.0)
Calcium, Ion: 1.1 mmol/L — ABNORMAL LOW (ref 1.15–1.40)
Calcium, Ion: 1.12 mmol/L — ABNORMAL LOW (ref 1.15–1.40)
HCT: 32 % — ABNORMAL LOW (ref 36.0–46.0)
HCT: 32 % — ABNORMAL LOW (ref 36.0–46.0)
Hemoglobin: 10.9 g/dL — ABNORMAL LOW (ref 12.0–15.0)
Hemoglobin: 10.9 g/dL — ABNORMAL LOW (ref 12.0–15.0)
O2 Saturation: 62 %
O2 Saturation: 72 %
Potassium: 3.3 mmol/L — ABNORMAL LOW (ref 3.5–5.1)
Potassium: 3.3 mmol/L — ABNORMAL LOW (ref 3.5–5.1)
Sodium: 139 mmol/L (ref 135–145)
Sodium: 140 mmol/L (ref 135–145)
TCO2: 27 mmol/L (ref 22–32)
TCO2: 28 mmol/L (ref 22–32)
pCO2, Ven: 45.7 mmHg (ref 44–60)
pCO2, Ven: 47.1 mmHg (ref 44–60)
pH, Ven: 7.352 (ref 7.25–7.43)
pH, Ven: 7.365 (ref 7.25–7.43)
pO2, Ven: 34 mmHg (ref 32–45)
pO2, Ven: 40 mmHg (ref 32–45)

## 2024-06-26 LAB — GLUCOSE, CAPILLARY
Glucose-Capillary: 201 mg/dL — ABNORMAL HIGH (ref 70–99)
Glucose-Capillary: 182 mg/dL — ABNORMAL HIGH (ref 70–99)

## 2024-06-26 LAB — POCT I-STAT 7, (LYTES, BLD GAS, ICA,H+H)
Acid-base deficit: 1 mmol/L (ref 0.0–2.0)
Bicarbonate: 23.8 mmol/L (ref 20.0–28.0)
Calcium, Ion: 1.09 mmol/L — ABNORMAL LOW (ref 1.15–1.40)
HCT: 31 % — ABNORMAL LOW (ref 36.0–46.0)
Hemoglobin: 10.5 g/dL — ABNORMAL LOW (ref 12.0–15.0)
O2 Saturation: 94 %
Potassium: 3.3 mmol/L — ABNORMAL LOW (ref 3.5–5.1)
Sodium: 140 mmol/L (ref 135–145)
TCO2: 25 mmol/L (ref 22–32)
pCO2 arterial: 38.7 mmHg (ref 32–48)
pH, Arterial: 7.398 (ref 7.35–7.45)
pO2, Arterial: 72 mmHg — ABNORMAL LOW (ref 83–108)

## 2024-06-26 SURGERY — RIGHT/LEFT HEART CATH AND CORONARY ANGIOGRAPHY
Anesthesia: LOCAL

## 2024-06-26 MED ORDER — HEPARIN (PORCINE) IN NACL 2-0.9 UNITS/ML
INTRAMUSCULAR | Status: DC | PRN
  Administered 2024-06-26: 10 mL via INTRA_ARTERIAL

## 2024-06-26 MED ORDER — MIDAZOLAM HCL (PF) 2 MG/2ML IJ SOLN
INTRAMUSCULAR | Status: DC | PRN
  Administered 2024-06-26: 1 mg via INTRAVENOUS

## 2024-06-26 MED ORDER — SODIUM CHLORIDE 0.9% FLUSH: 3.0000 mL | Freq: Two times a day (BID) | INTRAVENOUS | Status: DC

## 2024-06-26 MED ORDER — MIDAZOLAM HCL 2 MG/2ML IJ SOLN
INTRAMUSCULAR | Status: AC
  Filled 2024-06-26: qty 2

## 2024-06-26 MED ORDER — FENTANYL CITRATE (PF) 100 MCG/2ML IJ SOLN
INTRAMUSCULAR | Status: DC | PRN
  Administered 2024-06-26: 25 ug via INTRAVENOUS

## 2024-06-26 MED ORDER — SODIUM CHLORIDE 0.9% FLUSH: 3.0000 mL | INTRAVENOUS | Status: DC | PRN

## 2024-06-26 MED ORDER — FREE WATER: 500.0000 mL | Freq: Once | Status: DC

## 2024-06-26 MED ORDER — ASPIRIN 81 MG PO CHEW
81.0000 mg | CHEWABLE_TABLET | ORAL | Status: AC
  Administered 2024-06-26: 81 mg via ORAL
  Filled 2024-06-26: qty 1

## 2024-06-26 MED ORDER — APIXABAN 5 MG PO TABS: 5.0000 mg | ORAL_TABLET | Freq: Two times a day (BID) | ORAL | 0 refills | Status: AC

## 2024-06-26 MED ORDER — SODIUM CHLORIDE 0.9 % IV SOLN: 250.0000 mL | INTRAVENOUS | Status: DC | PRN

## 2024-06-26 MED ORDER — SODIUM CHLORIDE 0.9% FLUSH
3.0000 mL | Freq: Two times a day (BID) | INTRAVENOUS | Status: DC
Start: 2024-06-26 — End: 2024-06-26

## 2024-06-26 MED ORDER — HYDRALAZINE HCL 20 MG/ML IJ SOLN: 10.0000 mg | INTRAMUSCULAR | Status: DC | PRN

## 2024-06-26 MED ORDER — FENTANYL CITRATE (PF) 100 MCG/2ML IJ SOLN
INTRAMUSCULAR | Status: AC
  Filled 2024-06-26: qty 2

## 2024-06-26 MED ORDER — HEPARIN (PORCINE) IN NACL 1000-0.9 UT/500ML-% IV SOLN
INTRAVENOUS | Status: DC | PRN
  Administered 2024-06-26 (×2): 500 mL

## 2024-06-26 MED ORDER — LIDOCAINE HCL (PF) 1 % IJ SOLN
INTRAMUSCULAR | Status: DC | PRN
  Administered 2024-06-26 (×2): 2 mL

## 2024-06-26 MED ORDER — ONDANSETRON HCL 4 MG/2ML IJ SOLN: 4.0000 mg | Freq: Four times a day (QID) | INTRAMUSCULAR | Status: DC | PRN

## 2024-06-26 MED ORDER — ACETAMINOPHEN 325 MG PO TABS: 650.0000 mg | ORAL_TABLET | ORAL | Status: DC | PRN

## 2024-06-26 MED ORDER — FREE WATER: 250.0000 mL | Freq: Once | Status: DC

## 2024-06-26 MED ORDER — HEPARIN SODIUM (PORCINE) 1000 UNIT/ML IJ SOLN
INTRAMUSCULAR | Status: AC
  Filled 2024-06-26: qty 10

## 2024-06-26 MED ORDER — LABETALOL HCL 5 MG/ML IV SOLN: 10.0000 mg | INTRAVENOUS | Status: DC | PRN

## 2024-06-26 MED ORDER — VERAPAMIL HCL 2.5 MG/ML IV SOLN
INTRAVENOUS | Status: AC
  Filled 2024-06-26: qty 2

## 2024-06-26 MED ORDER — HEPARIN SODIUM (PORCINE) 1000 UNIT/ML IJ SOLN
INTRAMUSCULAR | Status: DC | PRN
  Administered 2024-06-26: 5000 [IU] via INTRAVENOUS

## 2024-06-26 MED ORDER — IOHEXOL 350 MG/ML SOLN
INTRAVENOUS | Status: DC | PRN
  Administered 2024-06-26: 75 mL

## 2024-06-26 SURGICAL SUPPLY — 13 items
CATH 5FR JL3.5 JR4 ANG PIG MP (CATHETERS) IMPLANT
CATH BALLN WEDGE 5F 110CM (CATHETERS) IMPLANT
CATH INFINITI AMBI 6FR TG (CATHETERS) IMPLANT
DEVICE RAD TR BAND REGULAR (VASCULAR PRODUCTS) IMPLANT
GLIDESHEATH SLEND SS 6F .021 (SHEATH) IMPLANT
GUIDEWIRE TIGER .035X300 (WIRE) IMPLANT
KIT ESSENTIALS PG (KITS) IMPLANT
PACK CARDIAC CATHETERIZATION (CUSTOM PROCEDURE TRAY) ×1 IMPLANT
SET ATX-X65L (MISCELLANEOUS) IMPLANT
SHEATH GLIDE SLENDER 4/5FR (SHEATH) IMPLANT
SHEATH PROBE COVER 6X72 (BAG) IMPLANT
WIRE ASAHI PROWATER 300CM (WIRE) IMPLANT
WIRE EMERALD 3MM-J .035X260CM (WIRE) IMPLANT

## 2024-06-26 NOTE — Interval H&P Note (Signed)
 History and Physical Interval Note:  06/26/2024 8:57 AM  Sarah Shelton  has presented today for surgery, with the diagnosis of aortic stenosis.  The various methods of treatment have been discussed with the patient and family. After consideration of risks, benefits and other options for treatment, the patient has consented to  Procedure(s): RIGHT/LEFT HEART CATH AND CORONARY ANGIOGRAPHY (N/A) as a surgical intervention.  The patient's history has been reviewed, patient examined, no change in status, stable for surgery.  I have reviewed the patient's chart and labs.  Questions were answered to the patient's satisfaction.     Shyler Hamill K Pike Scantlebury

## 2024-06-26 NOTE — Interval H&P Note (Signed)
 History and Physical Interval Note:  06/26/2024 7:28 AM  Sarah Shelton  has presented today for surgery, with the diagnosis of aortic stenosis.  The various methods of treatment have been discussed with the patient and family. After consideration of risks, benefits and other options for treatment, the patient has consented to  Procedure(s): RIGHT/LEFT HEART CATH AND CORONARY ANGIOGRAPHY (N/A) as a surgical intervention.  The patient's history has been reviewed, patient examined, no change in status, stable for surgery.  I have reviewed the patient's chart and labs.  Questions were answered to the patient's satisfaction.     Desiderio Dolata K Holly Pring

## 2024-06-26 NOTE — Progress Notes (Signed)
 Patient and patient son given discharge instructions, education provided no further questions at this time. Patient able to ambulate and void before discharge. Able to tolerate PO intake. Patient site is clean, dry, intact with no hematoma noted upon discharge. Patient son was called via telephone to give discharge education. States he understands the information and does not have any questions at this time.

## 2024-06-26 NOTE — Discharge Instructions (Addendum)
 Restart Eliquis 5 mg twice daily starting on Saturday, June 27, 2024.Brachial Site Care   This sheet gives you information about how to care for yourself after your procedure. Your health care provider may also give you more specific instructions. If you have problems or questions, contact your health care provider. What can I expect after the procedure? After the procedure, it is common to have: Bruising and tenderness at the catheter insertion area. Follow these instructions at home:  Insertion site care Follow instructions from your health care provider about how to take care of your insertion site. Make sure you: Wash your hands with soap and water before you change your bandage (dressing). If soap and water are not available, use hand sanitizer. Remove your dressing as told by your health care provider. In 24 hours Check your insertion site every day for signs of infection. Check for: Redness, swelling, or pain. Pus or a bad smell. Warmth. You may shower 24-48 hours after the procedure. Do not apply powder or lotion to the site.  Activity For 24 hours after the procedure, or as directed by your health care provider: Do not push or pull heavy objects with the affected arm. Do not drive yourself home from the hospital or clinic. You may drive 24 hours after the procedure unless your health care provider tells you not to. Do not lift anything that is heavier than 10 lb (4.5 kg), or the limit that you are told, until your health care provider says that it is safe.  For 24 hours

## 2024-06-27 ENCOUNTER — Encounter (HOSPITAL_COMMUNITY): Payer: Self-pay | Admitting: Internal Medicine

## 2024-06-29 NOTE — Progress Notes (Deleted)
 [address]       [place] [id number]             [phone]        Sarah Shelton [facility] Medical Record #[id number] Date of Birth: 1945-12-23  Referring: Nichole Senior, MD Primary Care: Nichole Senior, MD Primary Cardiologist:Traci Shlomo, MD  Chief Complaint:   No chief complaint on file.   History of Present Illness:     Sarah Shelton is a 78 y.o. female presents for surgical evaluation of ***  February 2023: When I saw her last she was referred for cardiac catheterization after stress test demonstrated some EKG changes.  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  She was asymptomatic from a dyspnea or anginal standpoint so no further interventions were pursued at that time.   April 2023: The patient continues to do very well.  She denies any exertional dyspnea, exertional angina, presyncope or syncope.  She is doing all of her activities of daily living without any limitations.  She denies any fatigue.  She has required no unplanned hospitalizations or emergency room visits.  She recently had an outpatient eye procedure done.  She is otherwise well and without complaints.  Plan: Given lack of significant symptoms follow-up in 3 months with echocardiogram.   May 2024: The patient continues to do well.  She denies any presyncope, syncope, chest pain, increasing fatigue, or exertional dyspnea.  She tells me she is busier now than she was before.  She fortunately has not required any emergency room visits or hospitalizations.  She is otherwise well and without significant complaints today.  Plan: Follow-up in 3 months   October 2025:  Patient consents to use of AI scribe. Patient reached out to our office with reports of increasing shortness of breath and she was scheduled for follow-up.   Over the past few weeks, she has experienced a significant increase in symptoms, including severe shortness of breath  with minimal exertion, such as walking short distances or bending down. She frequently needs to sit down to catch her breath, and her symptoms have considerably slowed her down.   She reports swelling in her ankles and feet, attributing it to water retention, as she has gained 10-12 pounds recently without changes in her eating habits. She recalls an episode at a grocery store where she felt faint and had difficulty breathing, requiring her granddaughter to drive her home. It took about two hours for her breathing to return to normal after resting.   No chest pain, tightness, or palpitations are present, but she mentions difficulty breathing when lying on her back, which resolves when she lies on her side. She has not experienced any recent lightheadedness when changing positions, although she used to feel dizzy occasionally when lying down at night.   Her past medical history includes aortic valve stenosis. She is currently taking a baby aspirin daily.   She expresses concern about starting a blood thinner due to frequent cuts and scratches from her pets, which include ten cats and several dogs. She worries about the risk of bleeding due to these minor injuries.    Past Medical History:  Diagnosis Date   Aortic stenosis    Bilateral hearing loss    wears hearing aids   Candida albicans infection    Carotid stenosis    Cellulitis of right lower extremity without foot    Chronic venous hypertension (idiopathic) with  ulcer and inflammation of right lower extremity (CODE)    Diabetes mellitus without complication (HCC)    Type 2, FreeStyle Libre-sensor located on upper left arm   Diabetic neuropathy (HCC)    Diabetic polyneuropathy (HCC)    Goiter    Hyperlipidemia    Hypertension    pt denies this dx as of 12/13/21   Hyperthyroidism    Hypothyroidism    LVH (left ventricular hypertrophy)    Nonrheumatic aortic valve stenosis    Scratches    CAT   Sleep related leg cramps    Tendon  pain    Ulcer of left lower extremity (HCC)    Ulcer of right lower extremity (HCC)    Unspecified abnormalities of gait and mobility    Unsteady gait    Vasculitis    Vitamin D  deficiency    Wears dentures    full    Past Surgical History:  Procedure Laterality Date   ABDOMINAL AORTOGRAM N/A 06/26/2024   Procedure: ABDOMINAL AORTOGRAM;  Surgeon: Wendel Lurena POUR, MD;  Location: [facility];  Service: Cardiovascular;  Laterality: N/A;   CATARACT EXTRACTION Bilateral    HERNIA REPAIR     KENALOG  INJECTION Right 12/14/2021   Procedure: AVASTIN  INJECTION;  Surgeon: Jarold Mayo, MD;  Location: [facility] OR;  Service: Ophthalmology;  Laterality: Right;   PARS PLANA VITRECTOMY Right 12/14/2021   Procedure: PARS PLANA VITRECTOMY WITH 25 GAUGE;  Surgeon: Jarold Mayo, MD;  Location: [facility] OR;  Service: Ophthalmology;  Laterality: Right;   PHOTOCOAGULATION WITH LASER Right 12/14/2021   Procedure: PHOTOCOAGULATION WITH LASER;  Surgeon: Jarold Mayo, MD;  Location: [facility] OR;  Service: Ophthalmology;  Laterality: Right;   RIGHT/LEFT HEART CATH AND CORONARY ANGIOGRAPHY N/A 12/11/2021   Procedure: RIGHT/LEFT HEART CATH AND CORONARY ANGIOGRAPHY;  Surgeon: Wendel Lurena POUR, MD;  Location: [facility];  Service: Cardiovascular;  Laterality: N/A;   RIGHT/LEFT HEART CATH AND CORONARY ANGIOGRAPHY N/A 06/26/2024   Procedure: RIGHT/LEFT HEART CATH AND CORONARY ANGIOGRAPHY;  Surgeon: Wendel Lurena POUR, MD;  Location: [facility];  Service: Cardiovascular;  Laterality: N/A;    Social History:  Social History   Tobacco Use  Smoking Status Never  Smokeless Tobacco Never    Social History   Substance and Sexual Activity  Alcohol  Use Never     Allergies  Allergen Reactions   Shellfish Allergy Hives    Patient states she ate shrimp years ago tongue and face swelled, broke out into hives. Had to go to emergency room.   Adhesive [Tape] Rash    Reaction to plastic bandages - fabric  bandages ok Paper Tape is okay to use.   Cortisone Rash    Topical cortisone      Current Outpatient Medications  Medication Sig Dispense Refill   apixaban (ELIQUIS) 5 MG TABS tablet Take 1 tablet (5 mg total) by mouth 2 (two) times daily. 60 tablet 0   Continuous Blood Gluc Sensor (FREESTYLE LIBRE 2 SENSOR) MISC CHANGE EVERY 14 DAYS TO MONITOR BLOOD GLUCOSE CONTINUOUSLY     Continuous Blood Gluc Sensor (FREESTYLE LIBRE 2 SENSOR) MISC CHANGE EVERY 14 DAYS TO MONITOR BLOOD GLUCOSE CONTINUOUSLY     Cyanocobalamin (VITAMIN B12) 1000 MCG TBCR Take 1,000 mcg by mouth daily.     furosemide (LASIX) 20 MG tablet Take 1 tablet (20 mg total) by mouth 2 (two) times daily. 60 tablet 3   insulin  glargine, 1 Unit Dial, (TOUJEO SOLOSTAR) 300 UNIT/ML Solostar Pen Inject 14  Units into the skin at bedtime.     levothyroxine (SYNTHROID) 125 MCG tablet Take 125 mcg by mouth daily before breakfast.     metoprolol succinate (TOPROL XL) 25 MG 24 hr tablet Take 1 tablet (25 mg total) by mouth at bedtime. 30 tablet 3   NOVOLOG  FLEXPEN 100 UNIT/ML FlexPen 6 Units 3 (three) times daily with meals.     TURMERIC PO Take 500 mg by mouth daily.     Zinc  50 MG TABS Take 50 mg by mouth daily.     No current facility-administered medications for this visit.    (Not in a hospital admission)   Family History  Problem Relation Age of [place]   Thyroid  cancer Mother    CAD Mother 51       50   Diabetes Father      Review of Systems:   ROS    Physical Exam: There were no vitals taken for this visit. Physical Exam    Cardiac Studies & Procedures   ______________________________________________________________________________________________ CARDIAC CATHETERIZATION  CARDIAC CATHETERIZATION 06/26/2024  Conclusion   RPDA lesion is 50% stenosed.   Mid Cx lesion is 40% stenosed.   Mid LAD lesion is 20% stenosed.   RPAV lesion is 40% stenosed.  1.  Tight right radial loop requiring coronary wire to  cross into complete procedure. 2.  Mild, nonobstructive coronary artery disease. 3.  Fick cardiac output of 4.3 L/min and Fick cardiac index of 2.7 L/min/m with the following hemodynamics: Right atrial pressure mean of 12 mmHg Right ventricular pressure 54/-2 with an end-diastolic pressure of 8 mmHg Wedge pressure mean of 20 mmHg V waves to 27 mmHg PA pressure 59/22 with a mean of 37 mmHg PVR of 3.95 Woods units PA pulsatility index of 3 4.  Capacious iliofemoral vessels bilaterally.  If transcatheter aortic valve replacement is pursued then the left side should be used as the bifurcation is well below the femoral head.  The bifurcation on the right side is at the mid femoral head level.  Summary: Continue evaluation for aortic valve intervention.  Findings Coronary Findings Diagnostic  Dominance: Right  Left Anterior Descending Mid LAD lesion is 20% stenosed.  Left Circumflex Mid Cx lesion is 40% stenosed.  Right Coronary Artery There is mild diffuse disease throughout the vessel.  Right Posterior Descending Artery RPDA lesion is 50% stenosed.  Right Posterior Atrioventricular Artery RPAV lesion is 40% stenosed.  Intervention  No interventions have been documented.   CARDIAC CATHETERIZATION  CARDIAC CATHETERIZATION 12/11/2021  Conclusion   RPDA lesion is 50% stenosed.  1.  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 2.  Normal cardiac output and index with mean RA pressure of 2 mmHg, mean PA pressure of 27 mmHg, and mean wedge pressure of 14 mmHg.  Findings Coronary Findings Diagnostic  Dominance: Right  Right Coronary Artery  Right Posterior Descending Artery RPDA lesion is 50% stenosed.  Intervention  No interventions have been documented.   STRESS TESTS  EXERCISE TOLERANCE TEST (ETT) 10/26/2021  Interpretation Summary   2.0 mm of down sloping ST depression in  the inferior and lateral leads was noted. ST depression began during stress. ST deviation persisted during recovery. ECG was interpretable and conclusive. The ECG was positive for ischemia.   Prior study not available for comparison.   ECHOCARDIOGRAM  ECHOCARDIOGRAM COMPLETE 06/12/2024  Narrative ECHOCARDIOGRAM REPORT    Patient Name:   Sarah Shelton Date of Exam: 06/12/2024 Medical Rec #:  [id number]          Height:       61.0 in Accession #:    [id number]         Weight:       150.0 lb Date of Birth:  [birth year]         BSA:          1.671 m Patient Age:    77 years           BP:           180/65 mmHg Patient Gender: F                  HR:           87 bpm. Exam Location:  [address]  Procedure: 2D Echo, Cardiac Doppler and Color Doppler (Both Spectral and Color Flow Doppler were utilized during procedure).  Indications:    I35.0 Severe AS  History:        Patient has prior history of Echocardiogram examinations, most recent 01/21/2023. AS; Risk Factors:Hypertension, Diabetes and Dyslipidemia.  Sonographer:    Elsie Bohr RDCS Referring Phys: [id number] ARUN K THUKKANI  IMPRESSIONS   1. Left ventricular ejection fraction, by estimation, is 40 to 45%. The left ventricle has mildly decreased function. The left ventricle demonstrates global hypokinesis. Left ventricular diastolic parameters are consistent with Grade I diastolic dysfunction (impaired relaxation). 2. Right ventricular systolic function is normal. The right ventricular size is mildly enlarged. There is moderately elevated pulmonary artery systolic pressure. The estimated right ventricular systolic pressure is 45.5 mmHg. 3. Left atrial size was severely dilated. 4. The mitral valve is normal in structure. Moderate mitral valve regurgitation. No evidence of mitral stenosis. Moderate mitral annular calcification. 5. Tricuspid valve regurgitation is moderate. 6. The aortic valve is calcified. There is severe  calcifcation of the aortic valve. There is moderate thickening of the aortic valve. Aortic valve regurgitation is mild. Severe aortic valve stenosis. Aortic valve area, by VTI measures 0.46 cm. Aortic valve mean gradient measures 41.0 mmHg. Aortic valve Vmax measures 4.13 m/s. 7. The inferior vena cava is normal in size with greater than 50% respiratory variability, suggesting right atrial pressure of 3 mmHg.  FINDINGS Left Ventricle: Left ventricular ejection fraction, by estimation, is 40 to 45%. The left ventricle has mildly decreased function. The left ventricle demonstrates global hypokinesis. The left ventricular internal cavity size was normal in size. There is no left ventricular hypertrophy. Left ventricular diastolic parameters are consistent with Grade I diastolic dysfunction (impaired relaxation).  Right Ventricle: The right ventricular size is mildly enlarged. No increase in right ventricular wall thickness. Right ventricular systolic function is normal. There is moderately elevated pulmonary artery systolic pressure. The tricuspid regurgitant velocity is 3.26 m/s, and with an assumed right atrial pressure of 3 mmHg, the estimated right ventricular systolic pressure is 45.5 mmHg.  Left Atrium: Left atrial size was severely dilated.  Right Atrium: Right atrial size was normal in size.  Pericardium: There is no evidence of pericardial effusion.  Mitral Valve: The mitral valve is normal in structure. Moderate mitral annular calcification. Moderate mitral valve regurgitation. No evidence of mitral valve stenosis.  Tricuspid Valve: The tricuspid valve is normal in structure. Tricuspid valve regurgitation is moderate . No evidence of tricuspid stenosis.  Aortic Valve: The aortic valve is calcified. There is severe calcifcation of the aortic valve. There is moderate thickening of the  aortic valve. Aortic valve regurgitation is mild. Aortic regurgitation PHT measures 408 msec. Severe  aortic stenosis is present. Aortic valve mean gradient measures 41.0 mmHg. Aortic valve peak gradient measures 68.2 mmHg. Aortic valve area, by VTI measures 0.46 cm.  Pulmonic Valve: The pulmonic valve was normal in structure. Pulmonic valve regurgitation is not visualized. No evidence of pulmonic stenosis.  Aorta: The aortic root is normal in size and structure.  Venous: The inferior vena cava is normal in size with greater than 50% respiratory variability, suggesting right atrial pressure of 3 mmHg.  IAS/Shunts: No atrial level shunt detected by color flow Doppler.   LEFT VENTRICLE PLAX 2D LVIDd:         4.00 cm   Diastology LVIDs:         3.20 cm   LV e' medial:    5.16 cm/s LV PW:         0.90 cm   LV E/e' medial:  32.9 LV IVS:        1.10 cm   LV e' lateral:   4.74 cm/s LVOT diam:     1.90 cm   LV E/e' lateral: 35.8 LV SV:         36 LV SV Index:   22 LVOT Area:     2.84 cm   RIGHT VENTRICLE            IVC RVSP:           45.5 mmHg  IVC diam: 1.40 cm  LEFT ATRIUM             Index        RIGHT ATRIUM           Index LA diam:        3.80 cm 2.27 cm/m   RA Pressure: 3.00 mmHg LA Vol (A2C):   87.7 ml 52.47 ml/m  RA Area:     16.00 cm LA Vol (A4C):   95.3 ml 57.02 ml/m  RA Volume:   44.30 ml  26.50 ml/m LA Biplane Vol: 91.1 ml 54.51 ml/m AORTIC VALVE AV Area (Vmax):    0.41 cm AV Area (Vmean):   0.44 cm AV Area (VTI):     0.46 cm AV Vmax:           413.00 cm/s AV Vmean:          278.400 cm/s AV VTI:            0.793 m AV Peak Grad:      68.2 mmHg AV Mean Grad:      41.0 mmHg LVOT Vmax:         59.30 cm/s LVOT Vmean:        42.720 cm/s LVOT VTI:          0.129 m LVOT/AV VTI ratio: 0.16 AI PHT:            408 msec  AORTA Ao Root diam: 2.60 cm Ao Asc diam:  3.60 cm  MITRAL VALVE                TRICUSPID VALVE MV Area (PHT): 2.82 cm     TR Peak grad:   42.5 mmHg MV Decel Time: 269 msec     TR Vmax:        326.00 cm/s MV E velocity: 169.80 cm/s  Estimated  RAP:  3.00 mmHg RVSP:           45.5 mmHg  SHUNTS Systemic VTI:  0.13 m Systemic Diam: 1.90 cm  Oneil Parchment MD Electronically signed by Oneil Parchment MD Signature Date/Time: 06/12/2024/4:25:45 PM    Final      CT SCANS  CT CORONARY MORPH W/CTA COR W/SCORE 11/29/2021  Addendum 11/30/2021 10:26 PM ADDENDUM REPORT: 11/30/2021 22:24  CLINICAL DATA:  75Fwith severe aortic stenosis being evaluated for a TAVR procedure.  EXAM: Cardiac TAVR CT  TECHNIQUE: The patient was scanned on a [employer]. A 120 kV retrospective scan was triggered in the descending thoracic aorta at 111 HU's. Gantry rotation speed was 250 msecs and collimation was .6 mm. No beta blockade or nitro were given. The 3D data set was reconstructed in 5% intervals of the R-R cycle. Systolic and diastolic phases were analyzed on a dedicated work station using MPR, MIP and VRT modes. The patient received 80 cc of contrast.  FINDINGS: Aortic Root:  Aortic valve: Trileaflet aortic valve with moderate calcifications  Aortic valve calcium score: 1092  Aortic annulus:  Diameter: 22mm x 18mm  Perimeter: 62mm  Area: 296 mm^2  Calcifications: No calcifications  Coronary height: Min Left - 12mm, Max Left - 16mm; Min Right - 10mm  Sinotubular height: Left cusp - 17mm; Right cusp - 16mm; Noncoronary cusp - 19mm  LVOT (as measured 3 mm below the annulus): 23mm x 18mm  Diameter: 20mm  Area: 304 mm^2  Calcifications: No calcifications  Aortic sinus width: Left cusp - 25mm; Right cusp - 24mm; Noncoronary cusp - 25mm  Sinotubular junction width: 27mm x 25mm  Optimum Fluoroscopic Angle for Delivery: RAO 4 CRA 5  Cardiac:  Right atrium: Mild enlargement  Right ventricle: Normal size  Pulmonary arteries: Normal size  Pulmonary veins: Normal configuration  Left atrium: Normal size  Left ventricle: Normal size  Pericardium: Normal thickness  Coronary arteries:  Calcium score 790  (91st percentile)  Normal coronary origin.  Right dominance.  Dense coronary calcifications extending from proximal to mid LAD, obscuring lumen in the mid LAD. Unable to exclude obstructive CAD.  IMPRESSION: 1. Trileaflet aortic valve with moderate calcifications (AV calcium score 1092)  2. Aortic annulus measures 22mm x 18mm in diameter with perimeter 62mm and area 296 mm^2. No annular or LVOT calcifications. Annular size is appropriate for a 23mm Evolut valve; however sinus of Valsalva diameter measures 24mm at the right coronary cusp, which is below the recommended minimum diameter (25mm) for placement of this valve. Recommend further discussion at structural heart conference, could consider 20mm Sapien 3 valve  3. Low coronary height to RCA, measuring 10mm. Adequate coronary height to left main (12mm)  4.  Optimum Fluoroscopic Angle for Delivery:  RAO 4 CRA 5  5.  Coronary calcium score 790 (91st percentile)  6. Nitroglycerin was not administered, which limits evaluation of coronaries. There are dense coronary calcifications extending from the proximal to mid LAD, obscuring the lumen in the mid LAD. Unable to exclude obstructive CAD. Cannot send study for CT FFR, as CT FFR modeling requires nitroglycerin induced hyperemia. Recommend cardiac catheterization   Electronically Signed By: Lonni Nanas M.D. On: 11/30/2021 22:24  Narrative EXAM: OVER-READ INTERPRETATION  CT CHEST  The following report is an over-read performed by radiologist Dr. Rea Marc of [facility] on 11/29/2021. This over-read does not include interpretation of cardiac or coronary anatomy or pathology. The coronary calcium score/coronary CTA interpretation by the cardiologist is attached.  COMPARISON:  None.  FINDINGS: Extracardiac findings will be described separately under dictation for contemporaneously obtained CTA chest,  abdomen and pelvis.  IMPRESSION: Please  see separate dictation for contemporaneously obtained CTA chest, abdomen and pelvis dated 11/29/2021 for full description of relevant extracardiac findings.  Electronically Signed: By: Rea Marc M.D. On: 11/29/2021 14:21     ______________________________________________________________________________________________      ECG ***    I have independently reviewed the above radiologic studies and discussed with the patient   Recent Lab Findings: Lab Results  Component Value Date   WBC 6.0 06/24/2024   HGB 10.9 (L) 06/26/2024   HCT 32.0 (L) 06/26/2024   PLT 229 06/24/2024   GLUCOSE 121 (H) 06/24/2024   CHOL 182 10/30/2021   TRIG 121 10/30/2021   HDL 63 10/30/2021   LDLCALC 98 10/30/2021   ALT 10 10/30/2021   AST 17 10/30/2021   NA 139 06/26/2024   K 3.3 (L) 06/26/2024   CL 100 06/24/2024   CREATININE 1.06 (H) 06/24/2024   BUN 24 06/24/2024   CO2 24 06/24/2024   TSH 0.224 (L) 10/30/2021      Assessment / Plan:   78 y.o. female with severe aortic stenosis.  STS score: ***.  NYHA Class ***.  The risks and benefits of *** TAVR were discussed in detail.  We also discussed possibility of an emergent sternotomy to address any procedural complications.  Based on our discussion, we collectively decided that an emergent sternotomy would *** be indicated.  The patient is *** agreeable to proceed.  Based on my review of her LHC, echo, and CTA, I agree with the multidisciplinary plan to proceed with a *** TAVR.      I  spent {CHL ONC TIME VISIT - DTPQU:[id number]} counseling the patient face to face.   Linnie MALVA Rayas 06/29/2024 8:14 AM

## 2024-07-01 ENCOUNTER — Ambulatory Visit (HOSPITAL_COMMUNITY)
Admission: RE | Admit: 2024-07-01 | Discharge: 2024-07-01 | Disposition: A | Discharge status: Home / Self Care | Source: Ambulatory Visit | Attending: Internal Medicine | Admitting: Internal Medicine

## 2024-07-01 DIAGNOSIS — I2510 Atherosclerotic heart disease of native coronary artery without angina pectoris: Secondary | ICD-10-CM | POA: Diagnosis not present

## 2024-07-01 DIAGNOSIS — I701 Atherosclerosis of renal artery: Secondary | ICD-10-CM | POA: Diagnosis not present

## 2024-07-01 DIAGNOSIS — I350 Nonrheumatic aortic (valve) stenosis: Secondary | ICD-10-CM | POA: Diagnosis not present

## 2024-07-01 DIAGNOSIS — I700 Atherosclerosis of aorta: Secondary | ICD-10-CM | POA: Diagnosis not present

## 2024-07-01 DIAGNOSIS — Z01810 Encounter for preprocedural cardiovascular examination: Secondary | ICD-10-CM | POA: Diagnosis not present

## 2024-07-01 DIAGNOSIS — K8020 Calculus of gallbladder without cholecystitis without obstruction: Secondary | ICD-10-CM | POA: Diagnosis not present

## 2024-07-01 MED ORDER — IOHEXOL 350 MG/ML SOLN
100.0000 mL | Freq: Once | INTRAVENOUS | Status: AC | PRN
  Administered 2024-07-01: 100 mL via INTRAVENOUS

## 2024-07-02 ENCOUNTER — Ambulatory Visit: Payer: Self-pay | Admitting: Internal Medicine

## 2024-07-03 ENCOUNTER — Ambulatory Visit
Attending: Thoracic Surgery (Cardiothoracic Vascular Surgery) | Admitting: Thoracic Surgery (Cardiothoracic Vascular Surgery)

## 2024-07-03 ENCOUNTER — Encounter: Admitting: Thoracic Surgery (Cardiothoracic Vascular Surgery)

## 2024-07-03 VITALS — BP 134/58 | HR 103 | Resp 18 | Ht 61.0 in | Wt 140.0 lb

## 2024-07-03 DIAGNOSIS — I350 Nonrheumatic aortic (valve) stenosis: Secondary | ICD-10-CM | POA: Diagnosis not present

## 2024-07-03 NOTE — Progress Notes (Signed)
 [address]       [place] [address]             [phone]        KEOSHA ROSSA [facility] Medical Record #[id number] Date of Birth: November 09, 1945  Referring: Nichole Senior, MD Primary Care: Nichole Senior, MD Primary Cardiologist:Traci Shlomo, MD  Chief Complaint:    Chief Complaint  Patient presents with   Aortic Stenosis    Review tavr workup    History of Present Illness:     Sarah Shelton is a 78 y.o. female presents for surgical evaluation of severe aortic stenosis.  She also has a history of diabetes, atrial fibrillation, and congestive heart failure with an EF of 40 to 45%.  She is a known patient of Dr. Wendel, and was offered TAVR 2 years ago.  She was asymptomatic this did not want to proceed.  Over the last several months she has had worsening shortness of breath, and heart failure symptoms.  After being diuresed she does feel better but does have decreased exercise tolerance.      Past Medical History:  Diagnosis Date   Aortic stenosis    Bilateral hearing loss    wears hearing aids   Candida albicans infection    Carotid stenosis    Cellulitis of right lower extremity without foot    Chronic venous hypertension (idiopathic) with ulcer and inflammation of right lower extremity (CODE)    Diabetes mellitus without complication (HCC)    Type 2, FreeStyle Libre-sensor located on upper left arm   Diabetic neuropathy (HCC)    Diabetic polyneuropathy (HCC)    Goiter    Hyperlipidemia    Hypertension    pt denies this dx as of 12/13/21   Hyperthyroidism    Hypothyroidism    LVH (left ventricular hypertrophy)    Nonrheumatic aortic valve stenosis    Scratches    CAT   Sleep related leg cramps    Tendon pain    Ulcer of left lower extremity (HCC)    Ulcer of right lower extremity (HCC)    Unspecified abnormalities of gait and mobility    Unsteady gait    Vasculitis    Vitamin D  deficiency    Wears dentures    full     Past Surgical History:  Procedure Laterality Date   ABDOMINAL AORTOGRAM N/A 06/26/2024   Procedure: ABDOMINAL AORTOGRAM;  Surgeon: Wendel Lurena POUR, MD;  Location: [facility] INVASIVE CV LAB;  Service: Cardiovascular;  Laterality: N/A;   CATARACT EXTRACTION Bilateral    HERNIA REPAIR     KENALOG  INJECTION Right 12/14/2021   Procedure: AVASTIN  INJECTION;  Surgeon: Jarold Mayo, MD;  Location: [facility] OR;  Service: Ophthalmology;  Laterality: Right;   PARS PLANA VITRECTOMY Right 12/14/2021   Procedure: PARS PLANA VITRECTOMY WITH 25 GAUGE;  Surgeon: Jarold Mayo, MD;  Location: [facility] OR;  Service: Ophthalmology;  Laterality: Right;   PHOTOCOAGULATION WITH LASER Right 12/14/2021   Procedure: PHOTOCOAGULATION WITH LASER;  Surgeon: Jarold Mayo, MD;  Location: [facility] OR;  Service: Ophthalmology;  Laterality: Right;   RIGHT/LEFT HEART CATH AND CORONARY ANGIOGRAPHY N/A 12/11/2021   Procedure: RIGHT/LEFT HEART CATH AND CORONARY ANGIOGRAPHY;  Surgeon: Wendel Lurena POUR, MD;  Location: [facility] INVASIVE CV LAB;  Service: Cardiovascular;  Laterality: N/A;   RIGHT/LEFT HEART CATH AND CORONARY ANGIOGRAPHY N/A 06/26/2024   Procedure: RIGHT/LEFT HEART CATH AND CORONARY ANGIOGRAPHY;  Surgeon: Wendel Lurena POUR, MD;  Location: [facility] INVASIVE  CV LAB;  Service: Cardiovascular;  Laterality: N/A;    Social History:  Social History   Tobacco Use  Smoking Status Never  Smokeless Tobacco Never    Social History   Substance and Sexual Activity  Alcohol  Use Never     Allergies  Allergen Reactions   Shellfish Allergy Hives    Patient states she ate shrimp years ago tongue and face swelled, broke out into hives. Had to go to emergency room.   Adhesive [Tape] Rash    Reaction to plastic bandages - fabric bandages ok Paper Tape is okay to use.   Cortisone Rash    Topical cortisone      Current Outpatient Medications  Medication Sig Dispense Refill   apixaban (ELIQUIS) 5 MG TABS tablet Take 1 tablet (5 mg total) by mouth  2 (two) times daily. 60 tablet 0   Continuous Blood Gluc Sensor (FREESTYLE LIBRE 2 SENSOR) MISC CHANGE EVERY 14 DAYS TO MONITOR BLOOD GLUCOSE CONTINUOUSLY     Continuous Blood Gluc Sensor (FREESTYLE LIBRE 2 SENSOR) MISC CHANGE EVERY 14 DAYS TO MONITOR BLOOD GLUCOSE CONTINUOUSLY     Cyanocobalamin (VITAMIN B12) 1000 MCG TBCR Take 1,000 mcg by mouth daily.     furosemide (LASIX) 20 MG tablet Take 1 tablet (20 mg total) by mouth 2 (two) times daily. 60 tablet 3   insulin  glargine, 1 Unit Dial, (TOUJEO SOLOSTAR) 300 UNIT/ML Solostar Pen Inject 14 Units into the skin at bedtime.     levothyroxine (SYNTHROID) 125 MCG tablet Take 125 mcg by mouth daily before breakfast.     metoprolol succinate (TOPROL XL) 25 MG 24 hr tablet Take 1 tablet (25 mg total) by mouth at bedtime. 30 tablet 3   NOVOLOG  FLEXPEN 100 UNIT/ML FlexPen 6 Units 3 (three) times daily with meals.     TURMERIC PO Take 500 mg by mouth daily.     Zinc  50 MG TABS Take 50 mg by mouth daily.     No current facility-administered medications for this visit.    (Not in a hospital admission)   Family History  Problem Relation Age of Onset   Thyroid  cancer Mother    CAD Mother 5       66   Diabetes Father      Review of Systems:   Review of Systems  Constitutional:  Positive for malaise/fatigue.  Respiratory:  Positive for shortness of breath.   Cardiovascular:  Positive for chest pain and leg swelling.  Neurological: Negative.       Physical Exam: BP (!) 134/58 (BP Location: Right Arm)   Pulse (!) 103   Resp 18   Ht 5' 1 (1.549 m)   Wt 140 lb (63.5 kg)   SpO2 98%   BMI 26.45 kg/m  Physical Exam Constitutional:      General: She is not in acute distress.    Appearance: She is not ill-appearing.  HENT:     Head: Normocephalic and atraumatic.  Eyes:     Extraocular Movements: Extraocular movements intact.  Cardiovascular:     Rate and Rhythm: Tachycardia present.  Pulmonary:     Effort: Pulmonary effort is  normal. No respiratory distress.  Abdominal:     General: Abdomen is flat. There is no distension.  Musculoskeletal:        General: Normal range of motion.     Cervical back: Normal range of motion.  Skin:    General: Skin is warm and dry.  Neurological:  General: No focal deficit present.     Mental Status: She is alert and oriented to person, place, and time.       Cardiac Studies & Procedures   ______________________________________________________________________________________________ CARDIAC CATHETERIZATION  CARDIAC CATHETERIZATION 06/26/2024  Conclusion   RPDA lesion is 50% stenosed.   Mid Cx lesion is 40% stenosed.   Mid LAD lesion is 20% stenosed.   RPAV lesion is 40% stenosed.  1.  Tight right radial loop requiring coronary wire to cross into complete procedure. 2.  Mild, nonobstructive coronary artery disease. 3.  Fick cardiac output of 4.3 L/min and Fick cardiac index of 2.7 L/min/m with the following hemodynamics: Right atrial pressure mean of 12 mmHg Right ventricular pressure 54/-2 with an end-diastolic pressure of 8 mmHg Wedge pressure mean of 20 mmHg V waves to 27 mmHg PA pressure 59/22 with a mean of 37 mmHg PVR of 3.95 Woods units PA pulsatility index of 3 4.  Capacious iliofemoral vessels bilaterally.  If transcatheter aortic valve replacement is pursued then the left side should be used as the bifurcation is well below the femoral head.  The bifurcation on the right side is at the mid femoral head level.  Summary: Continue evaluation for aortic valve intervention.  Findings Coronary Findings Diagnostic  Dominance: Right  Left Anterior Descending Mid LAD lesion is 20% stenosed.  Left Circumflex Mid Cx lesion is 40% stenosed.  Right Coronary Artery There is mild diffuse disease throughout the vessel.  Right Posterior Descending Artery RPDA lesion is 50% stenosed.  Right Posterior Atrioventricular Artery RPAV lesion is 40%  stenosed.  Intervention  No interventions have been documented.   CARDIAC CATHETERIZATION  CARDIAC CATHETERIZATION 12/11/2021  Conclusion   RPDA lesion is 50% stenosed.  1.  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 2.  Normal cardiac output and index with mean RA pressure of 2 mmHg, mean PA pressure of 27 mmHg, and mean wedge pressure of 14 mmHg.  Findings Coronary Findings Diagnostic  Dominance: Right  Right Coronary Artery  Right Posterior Descending Artery RPDA lesion is 50% stenosed.  Intervention  No interventions have been documented.   STRESS TESTS  EXERCISE TOLERANCE TEST (ETT) 10/26/2021  Interpretation Summary   2.0 mm of down sloping ST depression in the inferior and lateral leads was noted. ST depression began during stress. ST deviation persisted during recovery. ECG was interpretable and conclusive. The ECG was positive for ischemia.   Prior study not available for comparison.   ECHOCARDIOGRAM  ECHOCARDIOGRAM COMPLETE 06/12/2024  Narrative ECHOCARDIOGRAM REPORT    Patient Name:   ANIESA BOBACK Date of Exam: 06/12/2024 Medical Rec #:  [id number]          Height:       61.0 in Accession #:    [id number]         Weight:       150.0 lb Date of Birth:  1946-04-09         BSA:          1.671 m Patient Age:    77 years           BP:           180/65 mmHg Patient Gender: F                  HR:           87 bpm. Exam Location:  [facility]  [address]  Procedure: 2D Echo, Cardiac Doppler and Color Doppler (Both Spectral and Color Flow Doppler were utilized during procedure).  Indications:    I35.0 Severe AS  History:        Patient has prior history of Echocardiogram examinations, most recent 01/21/2023. AS; Risk Factors:Hypertension, Diabetes and Dyslipidemia.  Sonographer:    Elsie Bohr RDCS Referring Phys: [id number] ARUN K THUKKANI  IMPRESSIONS   1. Left  ventricular ejection fraction, by estimation, is 40 to 45%. The left ventricle has mildly decreased function. The left ventricle demonstrates global hypokinesis. Left ventricular diastolic parameters are consistent with Grade I diastolic dysfunction (impaired relaxation). 2. Right ventricular systolic function is normal. The right ventricular size is mildly enlarged. There is moderately elevated pulmonary artery systolic pressure. The estimated right ventricular systolic pressure is 45.5 mmHg. 3. Left atrial size was severely dilated. 4. The mitral valve is normal in structure. Moderate mitral valve regurgitation. No evidence of mitral stenosis. Moderate mitral annular calcification. 5. Tricuspid valve regurgitation is moderate. 6. The aortic valve is calcified. There is severe calcifcation of the aortic valve. There is moderate thickening of the aortic valve. Aortic valve regurgitation is mild. Severe aortic valve stenosis. Aortic valve area, by VTI measures 0.46 cm. Aortic valve mean gradient measures 41.0 mmHg. Aortic valve Vmax measures 4.13 m/s. 7. The inferior vena cava is normal in size with greater than 50% respiratory variability, suggesting right atrial pressure of 3 mmHg.  FINDINGS Left Ventricle: Left ventricular ejection fraction, by estimation, is 40 to 45%. The left ventricle has mildly decreased function. The left ventricle demonstrates global hypokinesis. The left ventricular internal cavity size was normal in size. There is no left ventricular hypertrophy. Left ventricular diastolic parameters are consistent with Grade I diastolic dysfunction (impaired relaxation).  Right Ventricle: The right ventricular size is mildly enlarged. No increase in right ventricular wall thickness. Right ventricular systolic function is normal. There is moderately elevated pulmonary artery systolic pressure. The tricuspid regurgitant velocity is 3.26 m/s, and with an assumed right atrial pressure of 3  mmHg, the estimated right ventricular systolic pressure is 45.5 mmHg.  Left Atrium: Left atrial size was severely dilated.  Right Atrium: Right atrial size was normal in size.  Pericardium: There is no evidence of pericardial effusion.  Mitral Valve: The mitral valve is normal in structure. Moderate mitral annular calcification. Moderate mitral valve regurgitation. No evidence of mitral valve stenosis.  Tricuspid Valve: The tricuspid valve is normal in structure. Tricuspid valve regurgitation is moderate . No evidence of tricuspid stenosis.  Aortic Valve: The aortic valve is calcified. There is severe calcifcation of the aortic valve. There is moderate thickening of the aortic valve. Aortic valve regurgitation is mild. Aortic regurgitation PHT measures 408 msec. Severe aortic stenosis is present. Aortic valve mean gradient measures 41.0 mmHg. Aortic valve peak gradient measures 68.2 mmHg. Aortic valve area, by VTI measures 0.46 cm.  Pulmonic Valve: The pulmonic valve was normal in structure. Pulmonic valve regurgitation is not visualized. No evidence of pulmonic stenosis.  Aorta: The aortic root is normal in size and structure.  Venous: The inferior vena cava is normal in size with greater than 50% respiratory variability, suggesting right atrial pressure of 3 mmHg.  IAS/Shunts: No atrial level shunt detected by color flow Doppler.   LEFT VENTRICLE PLAX 2D LVIDd:         4.00 cm   Diastology LVIDs:         3.20 cm   LV e' medial:  5.16 cm/s LV PW:         0.90 cm   LV E/e' medial:  32.9 LV IVS:        1.10 cm   LV e' lateral:   4.74 cm/s LVOT diam:     1.90 cm   LV E/e' lateral: 35.8 LV SV:         36 LV SV Index:   22 LVOT Area:     2.84 cm   RIGHT VENTRICLE            IVC RVSP:           45.5 mmHg  IVC diam: 1.40 cm  LEFT ATRIUM             Index        RIGHT ATRIUM           Index LA diam:        3.80 cm 2.27 cm/m   RA Pressure: 3.00 mmHg LA Vol (A2C):   87.7 ml 52.47  ml/m  RA Area:     16.00 cm LA Vol (A4C):   95.3 ml 57.02 ml/m  RA Volume:   44.30 ml  26.50 ml/m LA Biplane Vol: 91.1 ml 54.51 ml/m AORTIC VALVE AV Area (Vmax):    0.41 cm AV Area (Vmean):   0.44 cm AV Area (VTI):     0.46 cm AV Vmax:           413.00 cm/s AV Vmean:          278.400 cm/s AV VTI:            0.793 m AV Peak Grad:      68.2 mmHg AV Mean Grad:      41.0 mmHg LVOT Vmax:         59.30 cm/s LVOT Vmean:        42.720 cm/s LVOT VTI:          0.129 m LVOT/AV VTI ratio: 0.16 AI PHT:            408 msec  AORTA Ao Root diam: 2.60 cm Ao Asc diam:  3.60 cm  MITRAL VALVE                TRICUSPID VALVE MV Area (PHT): 2.82 cm     TR Peak grad:   42.5 mmHg MV Decel Time: 269 msec     TR Vmax:        326.00 cm/s MV E velocity: 169.80 cm/s  Estimated RAP:  3.00 mmHg RVSP:           45.5 mmHg  SHUNTS Systemic VTI:  0.13 m Systemic Diam: 1.90 cm  Oneil Parchment MD Electronically signed by Oneil Parchment MD Signature Date/Time: 06/12/2024/4:25:45 PM    Final      CT SCANS  CT CORONARY MORPH W/CTA COR W/SCORE 07/01/2024  Narrative CLINICAL DATA:  Aortic valve replacement (TAVR), pre-op eval  EXAM: Cardiac TAVR CT  TECHNIQUE: A non-contrast, gated CT scan was obtained with axial slices of 2.5 mm through the heart for aortic valve scoring. A 120 kV retrospective, gated, contrast cardiac scan was obtained. Gantry rotation speed was 230 msec and collimation was 0.63 mm. Nitroglycerin was not given. A delayed scan was obtained to exclude left atrial appendage thrombus. The 3D dataset was reconstructed in systole with motion correction. The 3D data set was reconstructed in 5% intervals of the 0-95% of the R-R cycle. Systolic and diastolic phases were analyzed  on a dedicated workstation using MPR, MIP, and VRT modes. The patient received 100 cc of contrast.  FINDINGS: Aortic Valve:  Tricuspid aortic valve with severely reduced cusp excursion. Severely  thickened and severely calcified aortic valve cusps.  AV calcium score: 1259  Virtual Basal Annulus Measurements:  Maximum/Minimum Diameter: 23.2 x 17.7 mm  Perimeter: 63.9 mm  Area:  311 mm2  No significant LVOT calcifications.  Membranous septal length: 5.6 mm  Based on these measurements, the annulus would be suitable for a 20 mm Sapien 3 valve. Alternatively, Heart Team can consider 23/26 mm Evolut valve, borderline measurements for 26 mm due to sinus dimensions. Recommend Heart Team discussion for valve selection.  Sinus of Valsalva Measurements:  Non-coronary:  26 mm  Right - coronary:  26 mm  Left - coronary:  28 mm  Coronary height and sinus of Valsalva Height:  Left main: 15 mm, Left sinus: 19 mm  Right coronary: 15 mm, Right sinus: 18 mm  Aorta: Normal variant 4 vessel branch pattern of aortic arch, left vertebral artery off the arch.  Sinotubular Junction:  26 mm  Ascending Thoracic Aorta:  34 mm  Aortic Arch:  25 mm  Descending Thoracic Aorta:  20 mm  Coronary Arteries: Normal coronary origin. Right dominance. The study was performed without use of NTG and insufficient for plaque evaluation. Coronary artery calcium seen in 3 vessel distribution.  Optimum Fluoroscopic Angle for Delivery: LAO 1 CAU 0  OTHER:  Atria: Severe biatrial enlargement  Left atrial appendage: likely slow flowing contrast in the tip of the appendage, however cannot exclude small LAA thrombus.  Mitral valve: Grossly normal, mild mitral annular calcifications.  Pulmonary artery: Normal caliber.  Pulmonary veins: Normal anatomy.  IMPRESSION: 1. Tricuspid aortic valve with severely reduced cusp excursion. Severely thickened and severely calcified aortic valve cusps. 2. Aortic valve calcium score: 1259 3. Annulus area: 311 mm2, suitable for 20 mm Sapien 3 valve. No LVOT calcifications. Membranous septal length 5.6 mm. 4. Sufficient coronary artery heights from  annulus. 5. Optimum fluoroscopic angle for delivery: LAO 1 CAU 0   Electronically Signed By: Soyla Merck M.D. On: 07/02/2024 12:56   CT SCANS  CT CORONARY MORPH W/CTA COR W/SCORE 11/29/2021  Addendum 11/30/2021 10:26 PM ADDENDUM REPORT: 11/30/2021 22:24  CLINICAL DATA:  75Fwith severe aortic stenosis being evaluated for a TAVR procedure.  EXAM: Cardiac TAVR CT  TECHNIQUE: The patient was scanned on a [employer]. A 120 kV retrospective scan was triggered in the descending thoracic aorta at 111 HU's. Gantry rotation speed was 250 msecs and collimation was .6 mm. No beta blockade or nitro were given. The 3D data set was reconstructed in 5% intervals of the R-R cycle. Systolic and diastolic phases were analyzed on a dedicated work station using MPR, MIP and VRT modes. The patient received 80 cc of contrast.  FINDINGS: Aortic Root:  Aortic valve: Trileaflet aortic valve with moderate calcifications  Aortic valve calcium score: 1092  Aortic annulus:  Diameter: 22mm x 18mm  Perimeter: 62mm  Area: 296 mm^2  Calcifications: No calcifications  Coronary height: Min Left - 12mm, Max Left - 16mm; Min Right - 10mm  Sinotubular height: Left cusp - 17mm; Right cusp - 16mm; Noncoronary cusp - 19mm  LVOT (as measured 3 mm below the annulus): 23mm x 18mm  Diameter: 20mm  Area: 304 mm^2  Calcifications: No calcifications  Aortic sinus width: Left cusp - 25mm; Right cusp - 24mm; Noncoronary cusp - 25mm  Sinotubular junction width: 27mm x 25mm  Optimum Fluoroscopic Angle for Delivery: RAO 4 CRA 5  Cardiac:  Right atrium: Mild enlargement  Right ventricle: Normal size  Pulmonary arteries: Normal size  Pulmonary veins: Normal configuration  Left atrium: Normal size  Left ventricle: Normal size  Pericardium: Normal thickness  Coronary arteries:  Calcium score 790 (91st percentile)  Normal coronary origin.  Right dominance.  Dense  coronary calcifications extending from proximal to mid LAD, obscuring lumen in the mid LAD. Unable to exclude obstructive CAD.  IMPRESSION: 1. Trileaflet aortic valve with moderate calcifications (AV calcium score 1092)  2. Aortic annulus measures 22mm x 18mm in diameter with perimeter 62mm and area 296 mm^2. No annular or LVOT calcifications. Annular size is appropriate for a 23mm Evolut valve; however sinus of Valsalva diameter measures 24mm at the right coronary cusp, which is below the recommended minimum diameter (25mm) for placement of this valve. Recommend further discussion at structural heart conference, could consider 20mm Sapien 3 valve  3. Low coronary height to RCA, measuring 10mm. Adequate coronary height to left main (12mm)  4.  Optimum Fluoroscopic Angle for Delivery:  RAO 4 CRA 5  5.  Coronary calcium score 790 (91st percentile)  6. Nitroglycerin was not administered, which limits evaluation of coronaries. There are dense coronary calcifications extending from the proximal to mid LAD, obscuring the lumen in the mid LAD. Unable to exclude obstructive CAD. Cannot send study for CT FFR, as CT FFR modeling requires nitroglycerin induced hyperemia. Recommend cardiac catheterization   Electronically Signed By: Lonni Nanas M.D. On: 11/30/2021 22:24  Narrative EXAM: OVER-READ INTERPRETATION  CT CHEST  The following report is an over-read performed by radiologist Dr. Rea Marc of [facility] on 11/29/2021. This over-read does not include interpretation of cardiac or coronary anatomy or pathology. The coronary calcium score/coronary CTA interpretation by the cardiologist is attached.  COMPARISON:  None.  FINDINGS: Extracardiac findings will be described separately under dictation for contemporaneously obtained CTA chest, abdomen and pelvis.  IMPRESSION: Please see separate dictation for contemporaneously obtained CTA chest,  abdomen and pelvis dated 11/29/2021 for full description of relevant extracardiac findings.  Electronically Signed: By: Rea Marc M.D. On: 11/29/2021 14:21     ______________________________________________________________________________________________      ECG afib    I have independently reviewed the above radiologic studies and discussed with the patient   Recent Lab Findings: Lab Results  Component Value Date   WBC 6.0 06/24/2024   HGB 10.9 (L) 06/26/2024   HCT 32.0 (L) 06/26/2024   PLT 229 06/24/2024   GLUCOSE 121 (H) 06/24/2024   CHOL 182 10/30/2021   TRIG 121 10/30/2021   HDL 63 10/30/2021   LDLCALC 98 10/30/2021   ALT 10 10/30/2021   AST 17 10/30/2021   NA 139 06/26/2024   K 3.3 (L) 06/26/2024   CL 100 06/24/2024   CREATININE 1.06 (H) 06/24/2024   BUN 24 06/24/2024   CO2 24 06/24/2024   TSH 0.224 (L) 10/30/2021      Assessment / Plan:   78 y.o. female with severe aortic stenosis.  STS score: 6.51.  NYHA Class II.  The risks and benefits of transfemoral TAVR were discussed in detail.  We also discussed possibility of an emergent sternotomy to address any procedural complications.  Based on our discussion, we collectively decided that an emergent sternotomy would not be indicated.  The patient is  agreeable to proceed.  Based on my review of her LHC,  echo, and CTA, I agree with the multidisciplinary plan to proceed with a transfermoral TAVR.      I  spent 40 minutes counseling the patient face to face.   Linnie MALVA Rayas 07/03/2024 3:01 PM

## 2024-07-03 NOTE — H&P (View-Only) (Signed)
 [address]       [place] [address]             [phone]        Sarah Shelton [facility] Medical Record #[id number] Date of Birth: November 09, 1945  Referring: Nichole Senior, MD Primary Care: Nichole Senior, MD Primary Cardiologist:Traci Shlomo, MD  Chief Complaint:    Chief Complaint  Patient presents with   Aortic Stenosis    Review tavr workup    History of Present Illness:     Sarah Shelton is a 78 y.o. female presents for surgical evaluation of severe aortic stenosis.  She also has a history of diabetes, atrial fibrillation, and congestive heart failure with an EF of 40 to 45%.  She is a known patient of Dr. Wendel, and was offered TAVR 2 years ago.  She was asymptomatic this did not want to proceed.  Over the last several months she has had worsening shortness of breath, and heart failure symptoms.  After being diuresed she does feel better but does have decreased exercise tolerance.      Past Medical History:  Diagnosis Date   Aortic stenosis    Bilateral hearing loss    wears hearing aids   Candida albicans infection    Carotid stenosis    Cellulitis of right lower extremity without foot    Chronic venous hypertension (idiopathic) with ulcer and inflammation of right lower extremity (CODE)    Diabetes mellitus without complication (HCC)    Type 2, FreeStyle Libre-sensor located on upper left arm   Diabetic neuropathy (HCC)    Diabetic polyneuropathy (HCC)    Goiter    Hyperlipidemia    Hypertension    pt denies this dx as of 12/13/21   Hyperthyroidism    Hypothyroidism    LVH (left ventricular hypertrophy)    Nonrheumatic aortic valve stenosis    Scratches    CAT   Sleep related leg cramps    Tendon pain    Ulcer of left lower extremity (HCC)    Ulcer of right lower extremity (HCC)    Unspecified abnormalities of gait and mobility    Unsteady gait    Vasculitis    Vitamin D  deficiency    Wears dentures    full     Past Surgical History:  Procedure Laterality Date   ABDOMINAL AORTOGRAM N/A 06/26/2024   Procedure: ABDOMINAL AORTOGRAM;  Surgeon: Wendel Lurena POUR, MD;  Location: [facility] INVASIVE CV LAB;  Service: Cardiovascular;  Laterality: N/A;   CATARACT EXTRACTION Bilateral    HERNIA REPAIR     KENALOG  INJECTION Right 12/14/2021   Procedure: AVASTIN  INJECTION;  Surgeon: Jarold Mayo, MD;  Location: [facility] OR;  Service: Ophthalmology;  Laterality: Right;   PARS PLANA VITRECTOMY Right 12/14/2021   Procedure: PARS PLANA VITRECTOMY WITH 25 GAUGE;  Surgeon: Jarold Mayo, MD;  Location: [facility] OR;  Service: Ophthalmology;  Laterality: Right;   PHOTOCOAGULATION WITH LASER Right 12/14/2021   Procedure: PHOTOCOAGULATION WITH LASER;  Surgeon: Jarold Mayo, MD;  Location: [facility] OR;  Service: Ophthalmology;  Laterality: Right;   RIGHT/LEFT HEART CATH AND CORONARY ANGIOGRAPHY N/A 12/11/2021   Procedure: RIGHT/LEFT HEART CATH AND CORONARY ANGIOGRAPHY;  Surgeon: Wendel Lurena POUR, MD;  Location: [facility] INVASIVE CV LAB;  Service: Cardiovascular;  Laterality: N/A;   RIGHT/LEFT HEART CATH AND CORONARY ANGIOGRAPHY N/A 06/26/2024   Procedure: RIGHT/LEFT HEART CATH AND CORONARY ANGIOGRAPHY;  Surgeon: Wendel Lurena POUR, MD;  Location: [facility] INVASIVE  CV LAB;  Service: Cardiovascular;  Laterality: N/A;    Social History:  Social History   Tobacco Use  Smoking Status Never  Smokeless Tobacco Never    Social History   Substance and Sexual Activity  Alcohol  Use Never     Allergies  Allergen Reactions   Shellfish Allergy Hives    Patient states she ate shrimp years ago tongue and face swelled, broke out into hives. Had to go to emergency room.   Adhesive [Tape] Rash    Reaction to plastic bandages - fabric bandages ok Paper Tape is okay to use.   Cortisone Rash    Topical cortisone      Current Outpatient Medications  Medication Sig Dispense Refill   apixaban (ELIQUIS) 5 MG TABS tablet Take 1 tablet (5 mg total) by mouth  2 (two) times daily. 60 tablet 0   Continuous Blood Gluc Sensor (FREESTYLE LIBRE 2 SENSOR) MISC CHANGE EVERY 14 DAYS TO MONITOR BLOOD GLUCOSE CONTINUOUSLY     Continuous Blood Gluc Sensor (FREESTYLE LIBRE 2 SENSOR) MISC CHANGE EVERY 14 DAYS TO MONITOR BLOOD GLUCOSE CONTINUOUSLY     Cyanocobalamin (VITAMIN B12) 1000 MCG TBCR Take 1,000 mcg by mouth daily.     furosemide (LASIX) 20 MG tablet Take 1 tablet (20 mg total) by mouth 2 (two) times daily. 60 tablet 3   insulin  glargine, 1 Unit Dial, (TOUJEO SOLOSTAR) 300 UNIT/ML Solostar Pen Inject 14 Units into the skin at bedtime.     levothyroxine (SYNTHROID) 125 MCG tablet Take 125 mcg by mouth daily before breakfast.     metoprolol succinate (TOPROL XL) 25 MG 24 hr tablet Take 1 tablet (25 mg total) by mouth at bedtime. 30 tablet 3   NOVOLOG  FLEXPEN 100 UNIT/ML FlexPen 6 Units 3 (three) times daily with meals.     TURMERIC PO Take 500 mg by mouth daily.     Zinc  50 MG TABS Take 50 mg by mouth daily.     No current facility-administered medications for this visit.    (Not in a hospital admission)   Family History  Problem Relation Age of Onset   Thyroid  cancer Mother    CAD Mother 5       66   Diabetes Father      Review of Systems:   Review of Systems  Constitutional:  Positive for malaise/fatigue.  Respiratory:  Positive for shortness of breath.   Cardiovascular:  Positive for chest pain and leg swelling.  Neurological: Negative.       Physical Exam: BP (!) 134/58 (BP Location: Right Arm)   Pulse (!) 103   Resp 18   Ht 5' 1 (1.549 m)   Wt 140 lb (63.5 kg)   SpO2 98%   BMI 26.45 kg/m  Physical Exam Constitutional:      General: She is not in acute distress.    Appearance: She is not ill-appearing.  HENT:     Head: Normocephalic and atraumatic.  Eyes:     Extraocular Movements: Extraocular movements intact.  Cardiovascular:     Rate and Rhythm: Tachycardia present.  Pulmonary:     Effort: Pulmonary effort is  normal. No respiratory distress.  Abdominal:     General: Abdomen is flat. There is no distension.  Musculoskeletal:        General: Normal range of motion.     Cervical back: Normal range of motion.  Skin:    General: Skin is warm and dry.  Neurological:  General: No focal deficit present.     Mental Status: She is alert and oriented to person, place, and time.       Cardiac Studies & Procedures   ______________________________________________________________________________________________ CARDIAC CATHETERIZATION  CARDIAC CATHETERIZATION 06/26/2024  Conclusion   RPDA lesion is 50% stenosed.   Mid Cx lesion is 40% stenosed.   Mid LAD lesion is 20% stenosed.   RPAV lesion is 40% stenosed.  1.  Tight right radial loop requiring coronary wire to cross into complete procedure. 2.  Mild, nonobstructive coronary artery disease. 3.  Fick cardiac output of 4.3 L/min and Fick cardiac index of 2.7 L/min/m with the following hemodynamics: Right atrial pressure mean of 12 mmHg Right ventricular pressure 54/-2 with an end-diastolic pressure of 8 mmHg Wedge pressure mean of 20 mmHg V waves to 27 mmHg PA pressure 59/22 with a mean of 37 mmHg PVR of 3.95 Woods units PA pulsatility index of 3 4.  Capacious iliofemoral vessels bilaterally.  If transcatheter aortic valve replacement is pursued then the left side should be used as the bifurcation is well below the femoral head.  The bifurcation on the right side is at the mid femoral head level.  Summary: Continue evaluation for aortic valve intervention.  Findings Coronary Findings Diagnostic  Dominance: Right  Left Anterior Descending Mid LAD lesion is 20% stenosed.  Left Circumflex Mid Cx lesion is 40% stenosed.  Right Coronary Artery There is mild diffuse disease throughout the vessel.  Right Posterior Descending Artery RPDA lesion is 50% stenosed.  Right Posterior Atrioventricular Artery RPAV lesion is 40%  stenosed.  Intervention  No interventions have been documented.   CARDIAC CATHETERIZATION  CARDIAC CATHETERIZATION 12/11/2021  Conclusion   RPDA lesion is 50% stenosed.  1.  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 2.  Normal cardiac output and index with mean RA pressure of 2 mmHg, mean PA pressure of 27 mmHg, and mean wedge pressure of 14 mmHg.  Findings Coronary Findings Diagnostic  Dominance: Right  Right Coronary Artery  Right Posterior Descending Artery RPDA lesion is 50% stenosed.  Intervention  No interventions have been documented.   STRESS TESTS  EXERCISE TOLERANCE TEST (ETT) 10/26/2021  Interpretation Summary   2.0 mm of down sloping ST depression in the inferior and lateral leads was noted. ST depression began during stress. ST deviation persisted during recovery. ECG was interpretable and conclusive. The ECG was positive for ischemia.   Prior study not available for comparison.   ECHOCARDIOGRAM  ECHOCARDIOGRAM COMPLETE 06/12/2024  Narrative ECHOCARDIOGRAM REPORT    Patient Name:   Sarah Shelton Date of Exam: 06/12/2024 Medical Rec #:  [id number]          Height:       61.0 in Accession #:    [id number]         Weight:       150.0 lb Date of Birth:  1946-04-09         BSA:          1.671 m Patient Age:    77 years           BP:           180/65 mmHg Patient Gender: F                  HR:           87 bpm. Exam Location:  [facility]  [address]  Procedure: 2D Echo, Cardiac Doppler and Color Doppler (Both Spectral and Color Flow Doppler were utilized during procedure).  Indications:    I35.0 Severe AS  History:        Patient has prior history of Echocardiogram examinations, most recent 01/21/2023. AS; Risk Factors:Hypertension, Diabetes and Dyslipidemia.  Sonographer:    Elsie Bohr RDCS Referring Phys: [id number] ARUN K THUKKANI  IMPRESSIONS   1. Left  ventricular ejection fraction, by estimation, is 40 to 45%. The left ventricle has mildly decreased function. The left ventricle demonstrates global hypokinesis. Left ventricular diastolic parameters are consistent with Grade I diastolic dysfunction (impaired relaxation). 2. Right ventricular systolic function is normal. The right ventricular size is mildly enlarged. There is moderately elevated pulmonary artery systolic pressure. The estimated right ventricular systolic pressure is 45.5 mmHg. 3. Left atrial size was severely dilated. 4. The mitral valve is normal in structure. Moderate mitral valve regurgitation. No evidence of mitral stenosis. Moderate mitral annular calcification. 5. Tricuspid valve regurgitation is moderate. 6. The aortic valve is calcified. There is severe calcifcation of the aortic valve. There is moderate thickening of the aortic valve. Aortic valve regurgitation is mild. Severe aortic valve stenosis. Aortic valve area, by VTI measures 0.46 cm. Aortic valve mean gradient measures 41.0 mmHg. Aortic valve Vmax measures 4.13 m/s. 7. The inferior vena cava is normal in size with greater than 50% respiratory variability, suggesting right atrial pressure of 3 mmHg.  FINDINGS Left Ventricle: Left ventricular ejection fraction, by estimation, is 40 to 45%. The left ventricle has mildly decreased function. The left ventricle demonstrates global hypokinesis. The left ventricular internal cavity size was normal in size. There is no left ventricular hypertrophy. Left ventricular diastolic parameters are consistent with Grade I diastolic dysfunction (impaired relaxation).  Right Ventricle: The right ventricular size is mildly enlarged. No increase in right ventricular wall thickness. Right ventricular systolic function is normal. There is moderately elevated pulmonary artery systolic pressure. The tricuspid regurgitant velocity is 3.26 m/s, and with an assumed right atrial pressure of 3  mmHg, the estimated right ventricular systolic pressure is 45.5 mmHg.  Left Atrium: Left atrial size was severely dilated.  Right Atrium: Right atrial size was normal in size.  Pericardium: There is no evidence of pericardial effusion.  Mitral Valve: The mitral valve is normal in structure. Moderate mitral annular calcification. Moderate mitral valve regurgitation. No evidence of mitral valve stenosis.  Tricuspid Valve: The tricuspid valve is normal in structure. Tricuspid valve regurgitation is moderate . No evidence of tricuspid stenosis.  Aortic Valve: The aortic valve is calcified. There is severe calcifcation of the aortic valve. There is moderate thickening of the aortic valve. Aortic valve regurgitation is mild. Aortic regurgitation PHT measures 408 msec. Severe aortic stenosis is present. Aortic valve mean gradient measures 41.0 mmHg. Aortic valve peak gradient measures 68.2 mmHg. Aortic valve area, by VTI measures 0.46 cm.  Pulmonic Valve: The pulmonic valve was normal in structure. Pulmonic valve regurgitation is not visualized. No evidence of pulmonic stenosis.  Aorta: The aortic root is normal in size and structure.  Venous: The inferior vena cava is normal in size with greater than 50% respiratory variability, suggesting right atrial pressure of 3 mmHg.  IAS/Shunts: No atrial level shunt detected by color flow Doppler.   LEFT VENTRICLE PLAX 2D LVIDd:         4.00 cm   Diastology LVIDs:         3.20 cm   LV e' medial:  5.16 cm/s LV PW:         0.90 cm   LV E/e' medial:  32.9 LV IVS:        1.10 cm   LV e' lateral:   4.74 cm/s LVOT diam:     1.90 cm   LV E/e' lateral: 35.8 LV SV:         36 LV SV Index:   22 LVOT Area:     2.84 cm   RIGHT VENTRICLE            IVC RVSP:           45.5 mmHg  IVC diam: 1.40 cm  LEFT ATRIUM             Index        RIGHT ATRIUM           Index LA diam:        3.80 cm 2.27 cm/m   RA Pressure: 3.00 mmHg LA Vol (A2C):   87.7 ml 52.47  ml/m  RA Area:     16.00 cm LA Vol (A4C):   95.3 ml 57.02 ml/m  RA Volume:   44.30 ml  26.50 ml/m LA Biplane Vol: 91.1 ml 54.51 ml/m AORTIC VALVE AV Area (Vmax):    0.41 cm AV Area (Vmean):   0.44 cm AV Area (VTI):     0.46 cm AV Vmax:           413.00 cm/s AV Vmean:          278.400 cm/s AV VTI:            0.793 m AV Peak Grad:      68.2 mmHg AV Mean Grad:      41.0 mmHg LVOT Vmax:         59.30 cm/s LVOT Vmean:        42.720 cm/s LVOT VTI:          0.129 m LVOT/AV VTI ratio: 0.16 AI PHT:            408 msec  AORTA Ao Root diam: 2.60 cm Ao Asc diam:  3.60 cm  MITRAL VALVE                TRICUSPID VALVE MV Area (PHT): 2.82 cm     TR Peak grad:   42.5 mmHg MV Decel Time: 269 msec     TR Vmax:        326.00 cm/s MV E velocity: 169.80 cm/s  Estimated RAP:  3.00 mmHg RVSP:           45.5 mmHg  SHUNTS Systemic VTI:  0.13 m Systemic Diam: 1.90 cm  Oneil Parchment MD Electronically signed by Oneil Parchment MD Signature Date/Time: 06/12/2024/4:25:45 PM    Final      CT SCANS  CT CORONARY MORPH W/CTA COR W/SCORE 07/01/2024  Narrative CLINICAL DATA:  Aortic valve replacement (TAVR), pre-op eval  EXAM: Cardiac TAVR CT  TECHNIQUE: A non-contrast, gated CT scan was obtained with axial slices of 2.5 mm through the heart for aortic valve scoring. A 120 kV retrospective, gated, contrast cardiac scan was obtained. Gantry rotation speed was 230 msec and collimation was 0.63 mm. Nitroglycerin was not given. A delayed scan was obtained to exclude left atrial appendage thrombus. The 3D dataset was reconstructed in systole with motion correction. The 3D data set was reconstructed in 5% intervals of the 0-95% of the R-R cycle. Systolic and diastolic phases were analyzed  on a dedicated workstation using MPR, MIP, and VRT modes. The patient received 100 cc of contrast.  FINDINGS: Aortic Valve:  Tricuspid aortic valve with severely reduced cusp excursion. Severely  thickened and severely calcified aortic valve cusps.  AV calcium score: 1259  Virtual Basal Annulus Measurements:  Maximum/Minimum Diameter: 23.2 x 17.7 mm  Perimeter: 63.9 mm  Area:  311 mm2  No significant LVOT calcifications.  Membranous septal length: 5.6 mm  Based on these measurements, the annulus would be suitable for a 20 mm Sapien 3 valve. Alternatively, Heart Team can consider 23/26 mm Evolut valve, borderline measurements for 26 mm due to sinus dimensions. Recommend Heart Team discussion for valve selection.  Sinus of Valsalva Measurements:  Non-coronary:  26 mm  Right - coronary:  26 mm  Left - coronary:  28 mm  Coronary height and sinus of Valsalva Height:  Left main: 15 mm, Left sinus: 19 mm  Right coronary: 15 mm, Right sinus: 18 mm  Aorta: Normal variant 4 vessel branch pattern of aortic arch, left vertebral artery off the arch.  Sinotubular Junction:  26 mm  Ascending Thoracic Aorta:  34 mm  Aortic Arch:  25 mm  Descending Thoracic Aorta:  20 mm  Coronary Arteries: Normal coronary origin. Right dominance. The study was performed without use of NTG and insufficient for plaque evaluation. Coronary artery calcium seen in 3 vessel distribution.  Optimum Fluoroscopic Angle for Delivery: LAO 1 CAU 0  OTHER:  Atria: Severe biatrial enlargement  Left atrial appendage: likely slow flowing contrast in the tip of the appendage, however cannot exclude small LAA thrombus.  Mitral valve: Grossly normal, mild mitral annular calcifications.  Pulmonary artery: Normal caliber.  Pulmonary veins: Normal anatomy.  IMPRESSION: 1. Tricuspid aortic valve with severely reduced cusp excursion. Severely thickened and severely calcified aortic valve cusps. 2. Aortic valve calcium score: 1259 3. Annulus area: 311 mm2, suitable for 20 mm Sapien 3 valve. No LVOT calcifications. Membranous septal length 5.6 mm. 4. Sufficient coronary artery heights from  annulus. 5. Optimum fluoroscopic angle for delivery: LAO 1 CAU 0   Electronically Signed By: Soyla Merck M.D. On: 07/02/2024 12:56   CT SCANS  CT CORONARY MORPH W/CTA COR W/SCORE 11/29/2021  Addendum 11/30/2021 10:26 PM ADDENDUM REPORT: 11/30/2021 22:24  CLINICAL DATA:  75Fwith severe aortic stenosis being evaluated for a TAVR procedure.  EXAM: Cardiac TAVR CT  TECHNIQUE: The patient was scanned on a [employer]. A 120 kV retrospective scan was triggered in the descending thoracic aorta at 111 HU's. Gantry rotation speed was 250 msecs and collimation was .6 mm. No beta blockade or nitro were given. The 3D data set was reconstructed in 5% intervals of the R-R cycle. Systolic and diastolic phases were analyzed on a dedicated work station using MPR, MIP and VRT modes. The patient received 80 cc of contrast.  FINDINGS: Aortic Root:  Aortic valve: Trileaflet aortic valve with moderate calcifications  Aortic valve calcium score: 1092  Aortic annulus:  Diameter: 22mm x 18mm  Perimeter: 62mm  Area: 296 mm^2  Calcifications: No calcifications  Coronary height: Min Left - 12mm, Max Left - 16mm; Min Right - 10mm  Sinotubular height: Left cusp - 17mm; Right cusp - 16mm; Noncoronary cusp - 19mm  LVOT (as measured 3 mm below the annulus): 23mm x 18mm  Diameter: 20mm  Area: 304 mm^2  Calcifications: No calcifications  Aortic sinus width: Left cusp - 25mm; Right cusp - 24mm; Noncoronary cusp - 25mm  Sinotubular junction width: 27mm x 25mm  Optimum Fluoroscopic Angle for Delivery: RAO 4 CRA 5  Cardiac:  Right atrium: Mild enlargement  Right ventricle: Normal size  Pulmonary arteries: Normal size  Pulmonary veins: Normal configuration  Left atrium: Normal size  Left ventricle: Normal size  Pericardium: Normal thickness  Coronary arteries:  Calcium score 790 (91st percentile)  Normal coronary origin.  Right dominance.  Dense  coronary calcifications extending from proximal to mid LAD, obscuring lumen in the mid LAD. Unable to exclude obstructive CAD.  IMPRESSION: 1. Trileaflet aortic valve with moderate calcifications (AV calcium score 1092)  2. Aortic annulus measures 22mm x 18mm in diameter with perimeter 62mm and area 296 mm^2. No annular or LVOT calcifications. Annular size is appropriate for a 23mm Evolut valve; however sinus of Valsalva diameter measures 24mm at the right coronary cusp, which is below the recommended minimum diameter (25mm) for placement of this valve. Recommend further discussion at structural heart conference, could consider 20mm Sapien 3 valve  3. Low coronary height to RCA, measuring 10mm. Adequate coronary height to left main (12mm)  4.  Optimum Fluoroscopic Angle for Delivery:  RAO 4 CRA 5  5.  Coronary calcium score 790 (91st percentile)  6. Nitroglycerin was not administered, which limits evaluation of coronaries. There are dense coronary calcifications extending from the proximal to mid LAD, obscuring the lumen in the mid LAD. Unable to exclude obstructive CAD. Cannot send study for CT FFR, as CT FFR modeling requires nitroglycerin induced hyperemia. Recommend cardiac catheterization   Electronically Signed By: Lonni Nanas M.D. On: 11/30/2021 22:24  Narrative EXAM: OVER-READ INTERPRETATION  CT CHEST  The following report is an over-read performed by radiologist Dr. Rea Marc of [facility] on 11/29/2021. This over-read does not include interpretation of cardiac or coronary anatomy or pathology. The coronary calcium score/coronary CTA interpretation by the cardiologist is attached.  COMPARISON:  None.  FINDINGS: Extracardiac findings will be described separately under dictation for contemporaneously obtained CTA chest, abdomen and pelvis.  IMPRESSION: Please see separate dictation for contemporaneously obtained CTA chest,  abdomen and pelvis dated 11/29/2021 for full description of relevant extracardiac findings.  Electronically Signed: By: Rea Marc M.D. On: 11/29/2021 14:21     ______________________________________________________________________________________________      ECG afib    I have independently reviewed the above radiologic studies and discussed with the patient   Recent Lab Findings: Lab Results  Component Value Date   WBC 6.0 06/24/2024   HGB 10.9 (L) 06/26/2024   HCT 32.0 (L) 06/26/2024   PLT 229 06/24/2024   GLUCOSE 121 (H) 06/24/2024   CHOL 182 10/30/2021   TRIG 121 10/30/2021   HDL 63 10/30/2021   LDLCALC 98 10/30/2021   ALT 10 10/30/2021   AST 17 10/30/2021   NA 139 06/26/2024   K 3.3 (L) 06/26/2024   CL 100 06/24/2024   CREATININE 1.06 (H) 06/24/2024   BUN 24 06/24/2024   CO2 24 06/24/2024   TSH 0.224 (L) 10/30/2021      Assessment / Plan:   78 y.o. female with severe aortic stenosis.  STS score: 6.51.  NYHA Class II.  The risks and benefits of transfemoral TAVR were discussed in detail.  We also discussed possibility of an emergent sternotomy to address any procedural complications.  Based on our discussion, we collectively decided that an emergent sternotomy would not be indicated.  The patient is  agreeable to proceed.  Based on my review of her LHC,  echo, and CTA, I agree with the multidisciplinary plan to proceed with a transfermoral TAVR.      I  spent 40 minutes counseling the patient face to face.   Linnie MALVA Rayas 07/03/2024 3:01 PM

## 2024-07-07 ENCOUNTER — Other Ambulatory Visit: Payer: Self-pay

## 2024-07-07 DIAGNOSIS — I350 Nonrheumatic aortic (valve) stenosis: Secondary | ICD-10-CM

## 2024-07-08 DIAGNOSIS — I10 Essential (primary) hypertension: Secondary | ICD-10-CM | POA: Diagnosis not present

## 2024-07-08 DIAGNOSIS — E1159 Type 2 diabetes mellitus with other circulatory complications: Secondary | ICD-10-CM | POA: Diagnosis not present

## 2024-07-08 DIAGNOSIS — Z794 Long term (current) use of insulin: Secondary | ICD-10-CM | POA: Diagnosis not present

## 2024-07-08 DIAGNOSIS — I2510 Atherosclerotic heart disease of native coronary artery without angina pectoris: Secondary | ICD-10-CM | POA: Diagnosis not present

## 2024-07-10 ENCOUNTER — Ambulatory Visit (HOSPITAL_COMMUNITY)
Admission: RE | Admit: 2024-07-10 | Discharge: 2024-07-10 | Disposition: A | Discharge status: Home / Self Care | Source: Ambulatory Visit | Attending: Internal Medicine | Admitting: Internal Medicine

## 2024-07-10 ENCOUNTER — Other Ambulatory Visit: Payer: Self-pay

## 2024-07-10 ENCOUNTER — Encounter (HOSPITAL_COMMUNITY)
Admission: RE | Admit: 2024-07-10 | Discharge: 2024-07-10 | Disposition: A | Discharge status: Home / Self Care | Source: Ambulatory Visit | Attending: Internal Medicine | Admitting: Internal Medicine

## 2024-07-10 DIAGNOSIS — I350 Nonrheumatic aortic (valve) stenosis: Secondary | ICD-10-CM | POA: Diagnosis not present

## 2024-07-10 DIAGNOSIS — Z01818 Encounter for other preprocedural examination: Secondary | ICD-10-CM | POA: Diagnosis not present

## 2024-07-10 DIAGNOSIS — I700 Atherosclerosis of aorta: Secondary | ICD-10-CM | POA: Diagnosis not present

## 2024-07-10 DIAGNOSIS — I517 Cardiomegaly: Secondary | ICD-10-CM | POA: Diagnosis not present

## 2024-07-10 DIAGNOSIS — Z01812 Encounter for preprocedural laboratory examination: Secondary | ICD-10-CM | POA: Insufficient documentation

## 2024-07-10 LAB — COMPREHENSIVE METABOLIC PANEL WITH GFR
ALT: 22 U/L (ref 0–44)
AST: 29 U/L (ref 15–41)
Albumin: 3.4 g/dL — ABNORMAL LOW (ref 3.5–5.0)
Alkaline Phosphatase: 73 U/L (ref 38–126)
Anion gap: 10 (ref 5–15)
BUN: 15 mg/dL (ref 8–23)
CO2: 25 mmol/L (ref 22–32)
Calcium: 8.3 mg/dL — ABNORMAL LOW (ref 8.9–10.3)
Chloride: 98 mmol/L (ref 98–111)
Creatinine, Ser: 1.21 mg/dL — ABNORMAL HIGH (ref 0.44–1.00)
GFR, Estimated: 46 mL/min — ABNORMAL LOW (ref >60)
Glucose, Bld: 312 mg/dL — ABNORMAL HIGH (ref 70–99)
Potassium: 4.1 mmol/L (ref 3.5–5.1)
Sodium: 133 mmol/L — ABNORMAL LOW (ref 135–145)
Total Bilirubin: 1.2 mg/dL (ref 0.0–1.2)
Total Protein: 7 g/dL (ref 6.5–8.1)

## 2024-07-10 LAB — CBC
HCT: 36.3 % (ref 36.0–46.0)
Hemoglobin: 11.6 g/dL — ABNORMAL LOW (ref 12.0–15.0)
MCH: 31.5 pg (ref 26.0–34.0)
MCHC: 32 g/dL (ref 30.0–36.0)
MCV: 98.6 fL (ref 80.0–100.0)
Platelets: 201 K/uL (ref 150–400)
RBC: 3.68 MIL/uL — ABNORMAL LOW (ref 3.87–5.11)
RDW: 13.4 % (ref 11.5–15.5)
WBC: 5.5 K/uL (ref 4.0–10.5)
nRBC: 0 % (ref 0.0–0.2)

## 2024-07-10 LAB — URINALYSIS, ROUTINE W REFLEX MICROSCOPIC
Bilirubin Urine: NEGATIVE
Glucose, UA: >=500 mg/dL — AB
Ketones, ur: NEGATIVE mg/dL
Nitrite: NEGATIVE
Protein, ur: 30 mg/dL — AB
Specific Gravity, Urine: 1.012 (ref 1.005–1.030)
WBC, UA: >50 WBC/hpf (ref 0–5)
pH: 5 (ref 5.0–8.0)

## 2024-07-10 LAB — SURGICAL PCR SCREEN
MRSA, PCR: NEGATIVE
Staphylococcus aureus: NEGATIVE

## 2024-07-10 LAB — PROTIME-INR
INR: 1.7 — ABNORMAL HIGH (ref 0.8–1.2)
Prothrombin Time: 20.8 s — ABNORMAL HIGH (ref 11.4–15.2)

## 2024-07-10 NOTE — Progress Notes (Signed)
 All consents signed by patient at PAT lab appointment. Pt was sent home with printed copy of surgical instructions and CHG soap/CHG soap instructions. All instructions reviewed with patient and questions answered.  Patients chart send to anesthesia for review. Pt denies any respiratory illness/infection in the last two months.

## 2024-07-13 ENCOUNTER — Encounter (HOSPITAL_COMMUNITY): Payer: Self-pay | Admitting: Vascular Surgery

## 2024-07-13 MED ORDER — POTASSIUM CHLORIDE 2 MEQ/ML IV SOLN
80.0000 meq | INTRAVENOUS | Status: DC
  Filled 2024-07-13: qty 40

## 2024-07-13 MED ORDER — CEFAZOLIN SODIUM-DEXTROSE 2-4 GM/100ML-% IV SOLN
2.0000 g | INTRAVENOUS | Status: DC
  Filled 2024-07-13: qty 100

## 2024-07-13 MED ORDER — NOREPINEPHRINE 4 MG/250ML-% IV SOLN
0.0000 ug/min | INTRAVENOUS | Status: DC
  Filled 2024-07-13: qty 250

## 2024-07-13 MED ORDER — DEXMEDETOMIDINE HCL IN NACL 400 MCG/100ML IV SOLN
0.1000 ug/kg/h | INTRAVENOUS | Status: DC
Start: 2024-07-14 — End: 2024-07-15
  Filled 2024-07-13: qty 100

## 2024-07-13 MED ORDER — HEPARIN 30,000 UNITS/1000 ML (OHS) CELLSAVER SOLUTION
Status: DC
  Filled 2024-07-13: qty 1000

## 2024-07-13 MED ORDER — MAGNESIUM SULFATE 50 % IJ SOLN
40.0000 meq | INTRAMUSCULAR | Status: DC
  Filled 2024-07-13: qty 9.85

## 2024-07-14 ENCOUNTER — Inpatient Hospital Stay (HOSPITAL_COMMUNITY): Admission: RE | Admit: 2024-07-14 | Source: Home / Self Care | Admitting: Internal Medicine

## 2024-07-14 ENCOUNTER — Encounter (HOSPITAL_COMMUNITY): Payer: Self-pay | Admitting: Vascular Surgery

## 2024-07-14 ENCOUNTER — Inpatient Hospital Stay (HOSPITAL_COMMUNITY)

## 2024-07-14 ENCOUNTER — Encounter (HOSPITAL_COMMUNITY): Admission: RE | Payer: Self-pay | Source: Home / Self Care

## 2024-07-14 DIAGNOSIS — I350 Nonrheumatic aortic (valve) stenosis: Secondary | ICD-10-CM

## 2024-07-14 SURGERY — TRANSCATHETER AORTIC VALVE REPLACEMENT, TRANSFEMORAL (CATHLAB)
Anesthesia: Monitor Anesthesia Care

## 2024-07-15 ENCOUNTER — Other Ambulatory Visit: Payer: Self-pay

## 2024-07-15 DIAGNOSIS — I350 Nonrheumatic aortic (valve) stenosis: Secondary | ICD-10-CM

## 2024-07-16 ENCOUNTER — Other Ambulatory Visit: Payer: Self-pay

## 2024-07-16 DIAGNOSIS — I350 Nonrheumatic aortic (valve) stenosis: Secondary | ICD-10-CM

## 2024-07-17 ENCOUNTER — Other Ambulatory Visit (HOSPITAL_COMMUNITY)

## 2024-07-20 MED ORDER — POTASSIUM CHLORIDE 2 MEQ/ML IV SOLN
80.0000 meq | INTRAVENOUS | Status: DC
  Filled 2024-07-20 (×2): qty 40

## 2024-07-20 MED ORDER — HEPARIN 30,000 UNITS/1000 ML (OHS) CELLSAVER SOLUTION
Status: DC
  Filled 2024-07-20 (×2): qty 1000

## 2024-07-20 MED ORDER — CEFAZOLIN SODIUM-DEXTROSE 2-4 GM/100ML-% IV SOLN
2.0000 g | INTRAVENOUS | Status: AC
  Administered 2024-07-21: 2 g via INTRAVENOUS
  Filled 2024-07-20: qty 100

## 2024-07-20 MED ORDER — NOREPINEPHRINE 4 MG/250ML-% IV SOLN
0.0000 ug/min | INTRAVENOUS | Status: DC
  Filled 2024-07-20: qty 250

## 2024-07-20 MED ORDER — MAGNESIUM SULFATE 50 % IJ SOLN
40.0000 meq | INTRAMUSCULAR | Status: DC
  Filled 2024-07-20 (×2): qty 9.85

## 2024-07-20 MED ORDER — DEXMEDETOMIDINE HCL IN NACL 400 MCG/100ML IV SOLN
0.1000 ug/kg/h | INTRAVENOUS | Status: AC
  Administered 2024-07-21: 63.52 ug via INTRAVENOUS
  Administered 2024-07-21: .7 ug/kg/h via INTRAVENOUS
  Filled 2024-07-20: qty 100

## 2024-07-21 ENCOUNTER — Inpatient Hospital Stay (HOSPITAL_COMMUNITY): Admitting: Certified Registered Nurse Anesthetist

## 2024-07-21 ENCOUNTER — Other Ambulatory Visit: Payer: Self-pay

## 2024-07-21 ENCOUNTER — Inpatient Hospital Stay (HOSPITAL_COMMUNITY)

## 2024-07-21 ENCOUNTER — Encounter (HOSPITAL_COMMUNITY): Payer: Self-pay | Admitting: Cardiovascular Disease

## 2024-07-21 ENCOUNTER — Inpatient Hospital Stay (HOSPITAL_COMMUNITY)
Admission: RE | Admit: 2024-07-21 | Discharge: 2024-07-22 | DRG: 267 | Disposition: A | Discharge status: Home / Self Care | Attending: Cardiovascular Disease | Admitting: Cardiovascular Disease

## 2024-07-21 ENCOUNTER — Encounter (HOSPITAL_COMMUNITY): Admission: RE | Disposition: A | Payer: Self-pay | Source: Home / Self Care | Attending: Cardiovascular Disease

## 2024-07-21 DIAGNOSIS — I350 Nonrheumatic aortic (valve) stenosis: Secondary | ICD-10-CM | POA: Diagnosis not present

## 2024-07-21 DIAGNOSIS — Z888 Allergy status to other drugs, medicaments and biological substances status: Secondary | ICD-10-CM

## 2024-07-21 DIAGNOSIS — I5022 Chronic systolic (congestive) heart failure: Secondary | ICD-10-CM | POA: Diagnosis present

## 2024-07-21 DIAGNOSIS — E039 Hypothyroidism, unspecified: Secondary | ICD-10-CM | POA: Diagnosis present

## 2024-07-21 DIAGNOSIS — Z9841 Cataract extraction status, right eye: Secondary | ICD-10-CM

## 2024-07-21 DIAGNOSIS — Z9842 Cataract extraction status, left eye: Secondary | ICD-10-CM | POA: Diagnosis not present

## 2024-07-21 DIAGNOSIS — Z952 Presence of prosthetic heart valve: Secondary | ICD-10-CM | POA: Diagnosis not present

## 2024-07-21 DIAGNOSIS — Z006 Encounter for examination for normal comparison and control in clinical research program: Secondary | ICD-10-CM | POA: Diagnosis not present

## 2024-07-21 DIAGNOSIS — I2510 Atherosclerotic heart disease of native coronary artery without angina pectoris: Secondary | ICD-10-CM | POA: Diagnosis present

## 2024-07-21 DIAGNOSIS — Z7901 Long term (current) use of anticoagulants: Secondary | ICD-10-CM | POA: Diagnosis not present

## 2024-07-21 DIAGNOSIS — I480 Paroxysmal atrial fibrillation: Secondary | ICD-10-CM | POA: Diagnosis not present

## 2024-07-21 DIAGNOSIS — Z794 Long term (current) use of insulin: Secondary | ICD-10-CM | POA: Diagnosis not present

## 2024-07-21 DIAGNOSIS — Z8249 Family history of ischemic heart disease and other diseases of the circulatory system: Secondary | ICD-10-CM | POA: Diagnosis not present

## 2024-07-21 DIAGNOSIS — Z79890 Hormone replacement therapy (postmenopausal): Secondary | ICD-10-CM | POA: Diagnosis not present

## 2024-07-21 DIAGNOSIS — I10 Essential (primary) hypertension: Secondary | ICD-10-CM

## 2024-07-21 DIAGNOSIS — R8290 Unspecified abnormal findings in urine: Secondary | ICD-10-CM | POA: Diagnosis present

## 2024-07-21 DIAGNOSIS — E785 Hyperlipidemia, unspecified: Secondary | ICD-10-CM | POA: Diagnosis present

## 2024-07-21 DIAGNOSIS — I110 Hypertensive heart disease with heart failure: Secondary | ICD-10-CM | POA: Diagnosis present

## 2024-07-21 DIAGNOSIS — H9193 Unspecified hearing loss, bilateral: Secondary | ICD-10-CM | POA: Diagnosis not present

## 2024-07-21 DIAGNOSIS — E1142 Type 2 diabetes mellitus with diabetic polyneuropathy: Secondary | ICD-10-CM | POA: Diagnosis present

## 2024-07-21 DIAGNOSIS — Z91013 Allergy to seafood: Secondary | ICD-10-CM | POA: Diagnosis not present

## 2024-07-21 DIAGNOSIS — I272 Pulmonary hypertension, unspecified: Secondary | ICD-10-CM | POA: Diagnosis not present

## 2024-07-21 DIAGNOSIS — Z91048 Other nonmedicinal substance allergy status: Secondary | ICD-10-CM

## 2024-07-21 DIAGNOSIS — E119 Type 2 diabetes mellitus without complications: Secondary | ICD-10-CM

## 2024-07-21 DIAGNOSIS — Z833 Family history of diabetes mellitus: Secondary | ICD-10-CM | POA: Diagnosis not present

## 2024-07-21 DIAGNOSIS — Z79899 Other long term (current) drug therapy: Secondary | ICD-10-CM

## 2024-07-21 DIAGNOSIS — I517 Cardiomegaly: Secondary | ICD-10-CM | POA: Diagnosis not present

## 2024-07-21 DIAGNOSIS — Z974 Presence of external hearing-aid: Secondary | ICD-10-CM

## 2024-07-21 DIAGNOSIS — Z808 Family history of malignant neoplasm of other organs or systems: Secondary | ICD-10-CM

## 2024-07-21 DIAGNOSIS — E0590 Thyrotoxicosis, unspecified without thyrotoxic crisis or storm: Secondary | ICD-10-CM

## 2024-07-21 DIAGNOSIS — I700 Atherosclerosis of aorta: Secondary | ICD-10-CM | POA: Diagnosis not present

## 2024-07-21 DIAGNOSIS — Z01818 Encounter for other preprocedural examination: Secondary | ICD-10-CM | POA: Diagnosis not present

## 2024-07-21 HISTORY — PX: INTRAOPERATIVE TRANSTHORACIC ECHOCARDIOGRAM: SHX6523

## 2024-07-21 LAB — TYPE AND SCREEN
ABO/RH(D): A NEG
ABO/RH(D): A NEG
Antibody Screen: NEGATIVE
Antibody Screen: NEGATIVE

## 2024-07-21 LAB — PROTIME-INR
INR: 1.2 (ref 0.8–1.2)
Prothrombin Time: 15.6 s — ABNORMAL HIGH (ref 11.4–15.2)

## 2024-07-21 LAB — COMPREHENSIVE METABOLIC PANEL WITH GFR
ALT: 20 U/L (ref 0–44)
AST: 25 U/L (ref 15–41)
Albumin: 2.8 g/dL — ABNORMAL LOW (ref 3.5–5.0)
Alkaline Phosphatase: 57 U/L (ref 38–126)
Anion gap: 13 (ref 5–15)
BUN: 13 mg/dL (ref 8–23)
CO2: 22 mmol/L (ref 22–32)
Calcium: 8.2 mg/dL — ABNORMAL LOW (ref 8.9–10.3)
Chloride: 105 mmol/L (ref 98–111)
Creatinine, Ser: 0.91 mg/dL (ref 0.44–1.00)
GFR, Estimated: >60 mL/min (ref >60)
Glucose, Bld: 125 mg/dL — ABNORMAL HIGH (ref 70–99)
Potassium: 4 mmol/L (ref 3.5–5.1)
Sodium: 140 mmol/L (ref 135–145)
Total Bilirubin: 1.2 mg/dL (ref 0.0–1.2)
Total Protein: 6.1 g/dL — ABNORMAL LOW (ref 6.5–8.1)

## 2024-07-21 LAB — ECHOCARDIOGRAM LIMITED
AR max vel: 1.85 cm2
AV Area VTI: 1.94 cm2
AV Area mean vel: 1.85 cm2
AV Mean grad: 5.5 mmHg
AV Peak grad: 10.6 mmHg
Ao pk vel: 1.63 m/s
Calc EF: 30.6 %
S' Lateral: 3.7 cm
Single Plane A2C EF: 34 %
Single Plane A4C EF: 29.2 %

## 2024-07-21 LAB — CBC
HCT: 33.8 % — ABNORMAL LOW (ref 36.0–46.0)
Hemoglobin: 11.2 g/dL — ABNORMAL LOW (ref 12.0–15.0)
MCH: 32.6 pg (ref 26.0–34.0)
MCHC: 33.1 g/dL (ref 30.0–36.0)
MCV: 98.3 fL (ref 80.0–100.0)
Platelets: 133 K/uL — ABNORMAL LOW (ref 150–400)
RBC: 3.44 MIL/uL — ABNORMAL LOW (ref 3.87–5.11)
RDW: 13.7 % (ref 11.5–15.5)
WBC: 5.5 K/uL (ref 4.0–10.5)
nRBC: 0 % (ref 0.0–0.2)

## 2024-07-21 LAB — GLUCOSE, CAPILLARY
Glucose-Capillary: 152 mg/dL — ABNORMAL HIGH (ref 70–99)
Glucose-Capillary: 141 mg/dL — ABNORMAL HIGH (ref 70–99)
Glucose-Capillary: 125 mg/dL — ABNORMAL HIGH (ref 70–99)

## 2024-07-21 SURGERY — TRANSCATHETER AORTIC VALVE REPLACEMENT, TRANSFEMORAL (CATHLAB)
Anesthesia: Monitor Anesthesia Care

## 2024-07-21 MED ORDER — ACETAMINOPHEN 650 MG RE SUPP: 650.0000 mg | Freq: Four times a day (QID) | RECTAL | Status: DC | PRN

## 2024-07-21 MED ORDER — HEPARIN SODIUM (PORCINE) 1000 UNIT/ML IJ SOLN
INTRAMUSCULAR | Status: DC | PRN
  Administered 2024-07-21: 3000 [IU] via INTRAVENOUS
  Administered 2024-07-21: 9200 [IU] via INTRAVENOUS

## 2024-07-21 MED ORDER — TRAMADOL HCL 50 MG PO TABS: 50.0000 mg | ORAL_TABLET | ORAL | Status: DC | PRN

## 2024-07-21 MED ORDER — NITROGLYCERIN IN D5W 200-5 MCG/ML-% IV SOLN: 0.0000 ug/min | INTRAVENOUS | Status: DC

## 2024-07-21 MED ORDER — IODIXANOL 320 MG/ML IV SOLN
INTRAVENOUS | Status: DC | PRN
  Administered 2024-07-21: 40 mL

## 2024-07-21 MED ORDER — CHLORHEXIDINE GLUCONATE 0.12 % MT SOLN
15.0000 mL | Freq: Once | OROMUCOSAL | Status: AC
  Administered 2024-07-21: 15 mL via OROMUCOSAL
  Filled 2024-07-21: qty 15

## 2024-07-21 MED ORDER — CHLORHEXIDINE GLUCONATE 4 % EX SOLN: 30.0000 mL | CUTANEOUS | Status: DC

## 2024-07-21 MED ORDER — INSULIN ASPART 100 UNIT/ML IJ SOLN: 0.0000 [IU] | INTRAMUSCULAR | Status: DC | PRN

## 2024-07-21 MED ORDER — OXYCODONE HCL 5 MG PO TABS: 5.0000 mg | ORAL_TABLET | ORAL | Status: DC | PRN

## 2024-07-21 MED ORDER — LEVOTHYROXINE SODIUM 25 MCG PO TABS
125.0000 ug | ORAL_TABLET | Freq: Every day | ORAL | Status: DC
  Administered 2024-07-22: 125 ug via ORAL
  Filled 2024-07-21: qty 1

## 2024-07-21 MED ORDER — CHLORHEXIDINE GLUCONATE 4 % EX SOLN: 60.0000 mL | Freq: Once | CUTANEOUS | Status: DC

## 2024-07-21 MED ORDER — PROPOFOL 500 MG/50ML IV EMUL
INTRAVENOUS | Status: DC | PRN
Start: 2024-07-21 — End: 2024-07-21
  Administered 2024-07-21: 25 ug/kg/min via INTRAVENOUS

## 2024-07-21 MED ORDER — SODIUM CHLORIDE 0.9 % IV SOLN: INTRAVENOUS | Status: AC

## 2024-07-21 MED ORDER — HEPARIN (PORCINE) IN NACL 2000-0.9 UNIT/L-% IV SOLN
INTRAVENOUS | Status: DC | PRN
  Administered 2024-07-21: 1000 mL

## 2024-07-21 MED ORDER — ONDANSETRON HCL 4 MG/2ML IJ SOLN: 4.0000 mg | Freq: Four times a day (QID) | INTRAMUSCULAR | Status: DC | PRN

## 2024-07-21 MED ORDER — MORPHINE SULFATE (PF) 2 MG/ML IV SOLN: 1.0000 mg | INTRAVENOUS | Status: DC | PRN

## 2024-07-21 MED ORDER — SODIUM CHLORIDE 0.9% FLUSH
3.0000 mL | Freq: Two times a day (BID) | INTRAVENOUS | Status: DC
  Administered 2024-07-22: 3 mL via INTRAVENOUS

## 2024-07-21 MED ORDER — PROTAMINE SULFATE 10 MG/ML IV SOLN
INTRAVENOUS | Status: DC | PRN
  Administered 2024-07-21: 120 mg via INTRAVENOUS

## 2024-07-21 MED ORDER — SODIUM CHLORIDE 0.9 % IV SOLN: 250.0000 mL | INTRAVENOUS | Status: DC | PRN

## 2024-07-21 MED ORDER — CEFAZOLIN SODIUM-DEXTROSE 2-4 GM/100ML-% IV SOLN
2.0000 g | Freq: Three times a day (TID) | INTRAVENOUS | Status: AC
  Administered 2024-07-21 – 2024-07-22 (×2): 2 g via INTRAVENOUS
  Filled 2024-07-21 (×2): qty 100

## 2024-07-21 MED ORDER — ACETAMINOPHEN 325 MG PO TABS: 650.0000 mg | ORAL_TABLET | Freq: Four times a day (QID) | ORAL | Status: DC | PRN

## 2024-07-21 MED ORDER — LIDOCAINE HCL (PF) 1 % IJ SOLN
INTRAMUSCULAR | Status: DC | PRN
  Administered 2024-07-21 (×3): 10 mL

## 2024-07-21 MED ORDER — NOREPINEPHRINE 4 MG/250ML-% IV SOLN
0.0000 ug/min | INTRAVENOUS | Status: DC
  Filled 2024-07-21: qty 250

## 2024-07-21 MED ORDER — INSULIN ASPART 100 UNIT/ML IJ SOLN
0.0000 [IU] | Freq: Three times a day (TID) | INTRAMUSCULAR | Status: DC
  Administered 2024-07-21 – 2024-07-22 (×2): 2 [IU] via SUBCUTANEOUS
  Administered 2024-07-22: 16 [IU] via SUBCUTANEOUS
  Filled 2024-07-21: qty 16
  Filled 2024-07-21: qty 4
  Filled 2024-07-21: qty 2

## 2024-07-21 MED ORDER — LACTATED RINGERS IV SOLN: INTRAVENOUS | Status: DC

## 2024-07-21 MED ORDER — SODIUM CHLORIDE 0.9% FLUSH: 3.0000 mL | INTRAVENOUS | Status: DC | PRN

## 2024-07-21 MED ORDER — SODIUM CHLORIDE 0.9 % IV SOLN: INTRAVENOUS | Status: DC

## 2024-07-21 SURGICAL SUPPLY — 24 items
BAG SNAP BAND KOVER 36X36 (MISCELLANEOUS) ×2 IMPLANT
CABLE ADAPT PACING TEMP 12FT (ADAPTER) IMPLANT
CATH DIAG 6FR PIGTAIL ANGLED (CATHETERS) IMPLANT
CATH INFINITI 5 FR STR PIGTAIL (CATHETERS) IMPLANT
CATH INFINITI 6F AL1 (CATHETERS) IMPLANT
CLOSURE MYNX CONTROL 6F/7F (Vascular Products) IMPLANT
CLOSURE PERCLOSE PROSTYLE (Vascular Products) IMPLANT
KIT MICROPUNCTURE NIT STIFF (SHEATH) IMPLANT
PACK CARDIAC CATHETERIZATION (CUSTOM PROCEDURE TRAY) ×1 IMPLANT
SET ATX-X65L (MISCELLANEOUS) IMPLANT
SHEATH DRYSEAL FLEX 14FR 33CM (SHEATH) IMPLANT
SHEATH PINNACLE 6F 10CM (SHEATH) IMPLANT
SHEATH PINNACLE 8F 10CM (SHEATH) IMPLANT
SHIELD CATH-GARD CONTAMINATION (MISCELLANEOUS) IMPLANT
STOPCOCK MORSE 400PSI 3WAY (MISCELLANEOUS) ×2 IMPLANT
SYSTEM EVOLUT FX DELIVRY 23-29 (CATHETERS) IMPLANT
SYSTEM EVOLUT FX LOADING 23-29 (CATHETERS) IMPLANT
VALVE EVOLUT FX 23 (Valve) IMPLANT
WIRE AMPLATZ SS-J .035X260CM (WIRE) IMPLANT
WIRE EMERALD 3MM-J .035X150CM (WIRE) IMPLANT
WIRE EMERALD 3MM-J .035X260CM (WIRE) IMPLANT
WIRE EMERALD ST .035X260CM (WIRE) IMPLANT
WIRE PACING TEMP ST TIP 5 (CATHETERS) IMPLANT
WIRE SAFARI SM CURVE 275 (WIRE) IMPLANT

## 2024-07-21 NOTE — Progress Notes (Signed)
 Patient brought to 4E from cath lab. Telemetry box applied, CCMD notified. Patient oriented to room and staff. Call bell in reach. All orders released.   07/21/24 1852  Vitals  Temp 97.7 F (36.5 C)  Temp Source Oral  BP (!) 147/79  MAP (mmHg) 96  BP Location Right Arm  BP Method Automatic  Patient Position (if appropriate) Lying  Pulse Rate 82  Pulse Rate Source Monitor  ECG Heart Rate 80  Resp 16  Level of Consciousness  Level of Consciousness Alert  MEWS COLOR  MEWS Score Color Green  Oxygen Therapy  SpO2 100 %  O2 Device Room Air  MEWS Score  MEWS Temp 0  MEWS Systolic 0  MEWS Pulse 0  MEWS RR 0  MEWS LOC 0  MEWS Score 0

## 2024-07-21 NOTE — Progress Notes (Signed)
  Echocardiogram 2D Echocardiogram has been performed.  Sarah Shelton 07/21/2024, 4:17 PM

## 2024-07-21 NOTE — Anesthesia Preprocedure Evaluation (Addendum)
 Anesthesia Evaluation  Patient identified by MRN, date of birth, ID band Patient awake    Reviewed: Allergy & Precautions, NPO status , Patient's Chart, lab work & pertinent test results  Airway Mallampati: II  TM Distance: >3 FB Neck ROM: Full    Dental  (+) Teeth Intact, Dental Advisory Given   Pulmonary neg pulmonary ROS   breath sounds clear to auscultation       Cardiovascular hypertension, Pt. on medications and Pt. on home beta blockers + Valvular Problems/Murmurs AS  Rhythm:Irregular Rate:Abnormal  Echo:   1. Left ventricular ejection fraction, by estimation, is 40 to 45%. The  left ventricle has mildly decreased function. The left ventricle  demonstrates global hypokinesis. Left ventricular diastolic parameters are  consistent with Grade I diastolic  dysfunction (impaired relaxation).   2. Right ventricular systolic function is normal. The right ventricular  size is mildly enlarged. There is moderately elevated pulmonary artery  systolic pressure. The estimated right ventricular systolic pressure is  45.5 mmHg.   3. Left atrial size was severely dilated.   4. The mitral valve is normal in structure. Moderate mitral valve  regurgitation. No evidence of mitral stenosis. Moderate mitral annular  calcification.   5. Tricuspid valve regurgitation is moderate.   6. The aortic valve is calcified. There is severe calcifcation of the  aortic valve. There is moderate thickening of the aortic valve. Aortic  valve regurgitation is mild. Severe aortic valve stenosis. Aortic valve  area, by VTI measures 0.46 cm. Aortic  valve mean gradient measures 41.0 mmHg. Aortic valve Vmax measures 4.13  m/s.   7. The inferior vena cava is normal in size with greater than 50%  respiratory variability, suggesting right atrial pressure of 3 mmHg.     Neuro/Psych  Neuromuscular disease  negative psych ROS   GI/Hepatic negative GI ROS, Neg  liver ROS,,,  Endo/Other  diabetes, Type 2, Insulin  DependentHypothyroidism Hyperthyroidism   Renal/GU negative Renal ROS     Musculoskeletal negative musculoskeletal ROS (+)    Abdominal   Peds  Hematology negative hematology ROS (+)   Anesthesia Other Findings   Reproductive/Obstetrics                              Anesthesia Physical Anesthesia Plan  ASA: 4  Anesthesia Plan: MAC   Post-op Pain Management: Minimal or no pain anticipated   Induction: Intravenous  PONV Risk Score and Plan: 0 and Propofol  infusion  Airway Management Planned: Natural Airway and Nasal Cannula  Additional Equipment: Arterial line  Intra-op Plan:   Post-operative Plan:   Informed Consent: I have reviewed the patients History and Physical, chart, labs and discussed the procedure including the risks, benefits and alternatives for the proposed anesthesia with the patient or authorized representative who has indicated his/her understanding and acceptance.       Plan Discussed with: CRNA  Anesthesia Plan Comments:          Anesthesia Quick Evaluation

## 2024-07-21 NOTE — Discharge Summary (Incomplete)
 HEART AND VASCULAR CENTER   MULTIDISCIPLINARY HEART VALVE TEAM  Discharge Summary    Patient ID: Sarah Shelton MRN: [id number]; DOB: [birth year]  Admit date: 07/21/2024 Discharge date: 07/22/2024  PCP:  Nichole Senior, MD  [facility] Cardiologist:  Wilbert Bihari, MD  [facility] Structural heart: Lonni Cash, MD [facility] Electrophysiologist:  None   Discharge Diagnoses    Principal Problem:   S/P TAVR (transcatheter aortic valve replacement) Active Problems:   Nonrheumatic aortic valve stenosis   Hypothyroidism   Hypertension   Hyperlipidemia   Diabetic polyneuropathy (HCC)   Diabetes mellitus without complication (HCC)   Allergies Allergies  Allergen Reactions   Shellfish Allergy Hives    Patient states she ate shrimp years ago tongue and face swelled, broke out into hives. Had to go to emergency room.   Adhesive [Tape] Rash    Reaction to plastic bandages - fabric bandages ok Paper Tape is okay to use.   Cortisone Rash    Topical cortisone    Diagnostic Studies/Procedures    HEART AND VASCULAR CENTER  TAVR OPERATIVE NOTE     Date of Procedure:                07/21/2024   Preoperative Diagnosis:      Severe Aortic Stenosis    Postoperative Diagnosis:    Same    Procedure:        Transcatheter Aortic Valve Replacement - Transfemoral Approach             Medtronic Evolut Pro FX THV (size 23 mm, model # [id number], serial #[id number] )              Co-Surgeons:                        Lonni Cash, MD and Con Clunes, MD    Anesthesiologist:                  [place]   Echocardiographer:              Delford   Pre-operative Echo Findings: Severe aortic stenosis Normal left ventricular systolic function   Post-operative Echo Findings: No paravalvular leak Normal left ventricular systolic function _____________    Echo 07/22/24: completed but pending formal read at the time of discharge   History of Present Illness      Sarah Shelton is a 78 y.o. female with a history of HTN, HLD, DMT2 on insulin , PAF on Eliquis, CAD, HFmrEF and severe AS who presented to [facility] on 07/21/24 for planned TAVR.   Sarah Shelton has been followed over time for severe AS. She recently developed heart failure symptoms and new LV dysfunction. Echo 06/12/24 showed EF 40-45%, mod pulm HTN, mod MAC with mod MR/TR, severe AS with mean grad 41 mmHg, AVA 0.44 cm2, and mild AI. [facility] 06/26/24 showed mild non obst CAD. She was seen in the office on 06/18/24 for structural heart consultation and noted to have new onset afib. She was started on Eliquis 5mg  BID and Toprol XL 25mg  daily.   The patient was evaluated by the multidisciplinary valve team and felt to have severe, symptomatic aortic stenosis and to be a suitable candidate for TAVR, which was set up for 07/21/24.  Hospital Course     Consultants: none   Severe AS:  -- S/p successful TAVR with a 23 mm Medtronic Evolut FX+ THV via the TF approach on 07/21/24.  -- Post  operative echo completed but pending formal read. -- Groin sites are stable.  -- No evidence of HAVB. -- Continue Eliquis 5mg  BID.  -- Met with cardiac rehab to discuss CRP phase II.  -- Plan for discharge home today with close follow up in the outpatient setting.   Atrial fibrillation with RVR: -- Recently diagnosed during structural heart office visit -- Started on Eliquis 5mg  BID but this is unaffordable to her. I reached out our pharmacy technicians and helped her fill out forms for patient assistance.  -- HRs are uncontrolled ~ 130 bpm. Resumed on Toprol XL 25mg , but will increase from 25mg  daily to BID.  -- If she remains in afib at her 1 month appt, I will get her set up for DCCV. Discussed the importance of not missing any doses of Eliquis.     HFmrEF: -- LVEDP 17 mm hg at the time of TAVR. -- Appears euvolemic.  -- Resume home Lasix 20mg  BID.   HTN: -- BP well controlled.  -- Resume home meds  DMT2:   -- Treated with SSI while admitted.  -- Resume home meds at discharge.   Bladder gas: -- Pre TAVR CT showed the urinary bladder contains nondependent air. This may be secondary to recent instrumentation such as bladder catheterization. Clinical correlation is recommended. UA showed mod hg and large leukocytes. -- Pt is asymptomatic but will treat with Bactrim out of an abundance of caution with new valve and gas in bladder/abnormal UA.    _____________  Discharge Vitals Blood pressure 128/72, pulse 98, temperature 98.4 F (36.9 C), temperature source Oral, resp. rate 16, height 5' (1.524 m), weight 66.5 kg, SpO2 96%.  Filed Weights   07/20/24 1400 07/21/24 1137 07/22/24 0343  Weight: 63.5 kg 61.2 kg 66.5 kg     GEN: Well nourished, well developed in no acute distress NECK: No JVD CARDIAC: irreg irreg, tachy, no murmurs, rubs, gallops RESPIRATORY:  Clear to auscultation without rales, wheezing or rhonchi  ABDOMEN: Soft, non-tender, non-distended EXTREMITIES:  No edema; No deformity.  Groin sites clear without hematoma or ecchymosis.    Disposition   Pt is being discharged home today in good condition.  Follow-up Plans & Appointments     Follow-up Information     Verlin Lonni BIRCH, MD. Go on 07/27/2024.   Specialty: Cardiology Why: @ 11:20am, please arrive at least 20 minutes early. Contact information: [address] [place] [address] [phone]                  Discharge Medications   Allergies as of 07/22/2024       Reactions   Shellfish Allergy Hives   Patient states she ate shrimp years ago tongue and face swelled, broke out into hives. Had to go to emergency room.   Adhesive [tape] Rash   Reaction to plastic bandages - fabric bandages ok Paper Tape is okay to use.   Cortisone Rash   Topical cortisone        Medication List     TAKE these medications    apixaban 5 MG Tabs tablet Commonly known as: ELIQUIS Take 1  tablet (5 mg total) by mouth 2 (two) times daily.   FreeStyle Libre 2 Sensor Misc CHANGE EVERY 14 DAYS TO MONITOR BLOOD GLUCOSE CONTINUOUSLY   FreeStyle Libre 2 Sensor Misc CHANGE EVERY 14 DAYS TO MONITOR BLOOD GLUCOSE CONTINUOUSLY   furosemide 20 MG tablet Commonly known as: LASIX Take 1 tablet (20 mg total) by mouth 2 (  two) times daily.   levothyroxine 125 MCG tablet Commonly known as: SYNTHROID Take 125 mcg by mouth daily before breakfast.   metoprolol succinate 25 MG 24 hr tablet Commonly known as: Toprol XL Take 1 tablet (25 mg total) by mouth 2 (two) times daily. What changed: when to take this   NovoLOG  FlexPen 100 UNIT/ML FlexPen Generic drug: insulin  aspart Inject 6 Units into the skin 3 (three) times daily with meals.   sulfamethoxazole-trimethoprim 800-160 MG tablet Commonly known as: Bactrim DS Take 1 tablet by mouth 2 (two) times daily.   Toujeo SoloStar 300 UNIT/ML Solostar Pen Generic drug: insulin  glargine (1 Unit Dial) Inject 14 Units into the skin at bedtime.   TURMERIC PO Take 500 mg by mouth daily.   Vitamin B12 1000 MCG Tbcr Take 1,000 mcg by mouth daily.   Zinc  50 MG Tabs Take 50 mg by mouth daily.            Outstanding Labs/Studies   none  ______________________  Duration of Discharge Encounter: APP Time: 20 minutes    Signed, Lamarr Hummer, PA-C 07/22/2024, 12:04 PM [phone]  Sarah Shelton was seen by me today along with Sarah Hummer, PA-C I have personally performed an evaluation on this patient.  My findings are as follows:  78 y.o. female with HTN, HLD, DM, HFmrEF, PAF on Eliquis and severe aortic stenosis admitted post TAVR. 23 mm Medtronic Evolut FX THV from the transfemoral approach. She is doing well today. Rate controlled atrial fib. No complaints.   Data: EKG(s) and pertinent labs, studies, etc were personally reviewed and interpreted by me:  EKG reviewed by me: atrial fib Telemetry reviewed by me:  atrial fib rate 100s All labs reviewed by me Otherwise, I agree with data as outlined by the advanced practice provider.  Exam performed by me: Gen: NAD Neck: No JVD Cardiac: Irreg irreg. No murmur Lungs: clear bilaterally Extremities: No LE edema  My Assessment and Plan:  Severe aortic stenosis: post TAVR. Doing well. Discharge home today on Eliquis.   PAF: rate controlled. REsume eliquis  The patient will be discharged home today  I spent 25 minutes seeing the patient. During that time, I reviewed the labs, EKG, telemetry, echo images, evaluated their symptoms and performed an examination. This time also included plan formulation, discussion of the plan with the patient and the time spent with documentation.   Signed,  Lonni Cash, MD  07/22/2024 5:16 PM

## 2024-07-21 NOTE — Anesthesia Procedure Notes (Signed)
 Arterial Line Insertion Start/End11/18/2025 12:40 PM, 07/21/2024 12:45 PM Performed by: Zelphia Norleen HERO, CRNA, CRNA  Patient location: Pre-op. Preanesthetic checklist: patient identified, IV checked, site marked, risks and benefits discussed, surgical consent, monitors and equipment checked, pre-op evaluation, timeout performed and anesthesia consent Lidocaine  1% used for infiltration radial was placed Catheter size: 20 G Hand hygiene performed  and maximum sterile barriers used   Attempts: 1 Procedure performed using ultrasound to evaluate access site. Following insertion, dressing applied. Post procedure assessment: normal and unchanged  Patient tolerated the procedure well with no immediate complications.

## 2024-07-21 NOTE — CV Procedure (Signed)
 HEART AND VASCULAR CENTER  TAVR OPERATIVE NOTE   Date of Procedure:  07/21/2024  Preoperative Diagnosis: Severe Aortic Stenosis   Postoperative Diagnosis: Same   Procedure:   Transcatheter Aortic Valve Replacement - Transfemoral Approach  Medtronic Evolut Pro FX THV (size 23 mm, model # [id number], serial #[id number] )   Co-Surgeons:  Lonni Cash, MD and Con Clunes, MD   Anesthesiologist:  [place]  Echocardiographer:  Delford  Pre-operative Echo Findings: Severe aortic stenosis Normal left ventricular systolic function  Post-operative Echo Findings: No paravalvular leak Normal left ventricular systolic function  BRIEF CLINICAL NOTE AND INDICATIONS FOR SURGERY  78 yo female with history of HTN, HLD, DM, PAF, CAD, HFmrEF and severe AS with recent worsened dyspnea on exertion. Echo with mild LV dysfunction. Severe aortic stenosis. Mild non-obstructive CAD on cardiac cath.   During the course of the patient's preoperative work up they have been evaluated comprehensively by a multidisciplinary team of specialists coordinated through the Multidisciplinary Heart Valve Clinic in the [facility].  They have been demonstrated to suffer from symptomatic severe aortic stenosis as noted above. The patient has been counseled extensively as to the relative risks and benefits of all options for the treatment of severe aortic stenosis including long term medical therapy, conventional surgery for aortic valve replacement, and transcatheter aortic valve replacement.  The patient has been independently evaluated by Dr. Shyrl with CT surgery and they are felt to be at high risk for conventional surgical aortic valve replacement. The surgeon indicated the patient would be a poor candidate for conventional surgery. Based upon review of all of the patient's preoperative diagnostic tests they are felt to be candidate for transcatheter aortic valve replacement using the  transfemoral approach as an alternative to high risk conventional surgery.    Following the decision to proceed with transcatheter aortic valve replacement, a discussion has been held regarding what types of management strategies would be attempted intraoperatively in the event of life-threatening complications, including whether or not the patient would be considered a candidate for the use of cardiopulmonary bypass and/or conversion to open sternotomy for attempted surgical intervention.  The patient has been advised of a variety of complications that might develop peculiar to this approach including but not limited to risks of death, stroke, paravalvular leak, aortic dissection or other major vascular complications, aortic annulus rupture, device embolization, cardiac rupture or perforation, acute myocardial infarction, arrhythmia, heart block or bradycardia requiring permanent pacemaker placement, congestive heart failure, respiratory failure, renal failure, pneumonia, infection, other late complications related to structural valve deterioration or migration, or other complications that might ultimately cause a temporary or permanent loss of functional independence or other long term morbidity.  The patient provides full informed consent for the procedure as described and all questions were answered preoperatively.    DETAILS OF THE OPERATIVE PROCEDURE  PREPARATION:   The patient is brought to the operating room on the above mentioned date and central monitoring was established by the anesthesia team including placement of a radial arterial line. The patient is placed in the supine position on the operating table.  Intravenous antibiotics are administered. Conscious sedation is used.   Baseline transthoracic echocardiogram was performed. The patient's chest, abdomen, both groins, and both lower extremities are prepared and draped in a sterile manner. A time out procedure is performed.   PERIPHERAL  ACCESS:   Using the modified Seldinger technique, femoral arterial and venous access were obtained with placement of a 6 Fr  sheath in the right femoral artery and a 7 Fr sheath in the vein in the right internal jugular vein using u/s guidance.  A pigtail diagnostic catheter was passed through the femoral arterial sheath under fluoroscopic guidance into the aortic root.  A temporary transvenous pacemaker catheter was passed through the jugular venous sheath under fluoroscopic guidance into the right ventricle.  The pacemaker was tested to ensure stable lead placement and pacemaker capture.   TRANSFEMORAL ACCESS:  A micropuncture kit was used to gain access to the left femoral artery using u/s guidance. Position confirmed with angiography. Pre-closure with double ProGlide closure devices. The patient was heparinized systemically and ACT verified > 250 seconds.    A 14 Fr transfemoral Dry Seal sheath was introduced into the left femoral artery over an Amplatz superstiff wire. An AL-1 catheter was used to direct a straight-tip exchange length wire across the native aortic valve into the left ventricle. This was exchanged out for a pigtail catheter and position was confirmed in the LV apex. Simultaneous LV and Ao pressures were recorded.  The pigtail catheter was then exchanged for an Confida wire in the LV apex.   TRANSCATHETER HEART VALVE DEPLOYMENT:  A Medtronic Evolut FX THV (size 23 mm) was prepared and per manufacturer's guidelines, and the proper orientation of the valve is confirmed on the delivery system.  The valve was advanced through the introducer sheath into the descending aorta. The valve was then advanced across the aortic arch. The valve was carefully positioned across the aortic valve annulus. Once final position of the valve has been confirmed with angiographic assessment in the cusp overlap view and in the LAO view, the valve is deployed with controlled rapid pacing. The valve is taken to 80%  deployment and appropriate depth is confirmed. The valve is then released from each paddle. There is no valvular leak and no central aortic insufficiency.  The patient's hemodynamic recovery following valve deployment is good.  Echo demostrated acceptable post-procedural gradients, stable mitral valve function, and no AI.   PROCEDURE COMPLETION:  The sheath was then removed and closure devices were completed. Protamine was administered once femoral arterial repair was complete. The temporary pacemaker, pigtail catheters and femoral sheaths were removed with a Mynx closure device placed in the artery and manual pressure used for venous hemostasis.    The patient tolerated the procedure well and is transported to the surgical intensive care in stable condition. There were no immediate intraoperative complications. All sponge instrument and needle counts are verified correct at completion of the operation.   No blood products were administered during the operation.  The patient received a total of 40 mL of intravenous contrast during the procedure.  LVEDP 17 mmHg  Lonni Cash MD 07/21/2024 3:00 PM

## 2024-07-21 NOTE — Discharge Instructions (Signed)

## 2024-07-21 NOTE — Op Note (Signed)
 [facility]   MULTIDISCIPLINARY HEART VALVE TEAM   TAVR OPERATIVE NOTE   Date of Procedure:  07/21/2024  Preoperative Diagnosis: Severe Aortic Stenosis   Postoperative Diagnosis: Same   Procedure:   Transcatheter Aortic Valve Replacement - Percutaneous Transfemoral Approach  Medtronic Evolut FX (size 23 mm, model # [id number] , serial # [id number])   Co-Surgeons:  Con Clunes, MD and Lonni Cash, MD   Anesthesiologist:  Tilford, MD  Echocardiographer:  Delford, MD  Pre-operative Echo Findings: Severe aortic stenosis Mildly decreased left ventricular systolic function  Post-operative Echo Findings: No paravalvular leak Mildly decreased left ventricular systolic function  BRIEF CLINICAL NOTE AND INDICATIONS FOR SURGERY  Sarah Shelton is a 78 y.o. female presents for surgical evaluation of severe aortic stenosis.  She also has a history of diabetes, atrial fibrillation, and congestive heart failure with an EF of 40 to 45%.  She is a known patient of Dr. Wendel, and was offered TAVR 2 years ago.  She was asymptomatic this did not want to proceed.  Over the last several months she has had worsening shortness of breath, and heart failure symptoms.  After being diuresed she does feel better but does have decreased exercise tolerance.   DETAILS OF THE OPERATIVE PROCEDURE  PREPARATION:    The patient was brought to the operating room on the above mentioned date and appropriate monitoring was established by the anesthesia team. The patient was placed in the supine position on the operating table.  Intravenous antibiotics were administered. The patient was monitored closely throughout the procedure under conscious sedation.  Baseline transthoracic echocardiogram was performed. The patient's abdomen and both groins were prepped and draped in a sterile manner. A time out procedure was performed.   PERIPHERAL ACCESS:    Using the modified Seldinger  technique, right internal jugular venous access was obtained with placement of a 6Fr sheath.  A temporary transvenous pacemaker was passed into the right ventricle.  The pacemaker was tested to ensure stable lead placement and pacemaker capture.  The right femoral artery was accessed with placement of 6 Fr sheath.  A pigtail diagnostic catheter was passed through the right arterial sheath under fluoroscopic guidance into the aortic root.  Aortic root angiography was performed in order to determine the optimal angiographic angle for valve deployment.  TRANSFEMORAL ACCESS:   Percutaneous transfemoral access and sheath placement was performed using ultrasound guidance.  The left common femoral artery was cannulated using a micropuncture needle and appropriate location was verified using hand injection angiogram.  A pair of Abbott Perclose percutaneous closure devices were placed and a 6 French sheath replaced into the femoral artery.  The patient was heparinized systemically and ACT verified > 250 seconds.    A 14 Fr Dryseal sheath was introduced into the left common femoral artery after progressively dilating over an Amplatz superstiff wire. An AL-1 catheter was used to direct a straight-tip exchange length wire across the native aortic valve into the left ventricle. This was exchanged out for a pigtail catheter and position was confirmed in the LV apex. Simultaneous LV and Ao pressures were recorded.  The LVEDP was not elevated.  The pigtail catheter was exchanged for a Safari wire in the LV apex. The Dryseal sheath was exchanged for the Medtronic Evolut delivery system.  TRANSCATHETER HEART VALVE DEPLOYMENT:   A Medtronic Evolut FX THV size 23 mm was prepared and per manufacturer's guidelines, and the proper orientation of the valve is confirmed on the delivery  system.  The valve was advanced through the introducer sheath into the descending aorta. The valve was then advanced across the aortic arch. The  valve was carefully positioned across the aortic valve annulus. Once final position of the valve has been confirmed with angiographic assessment in the cusp overlap view and in the LAO view, the valve is deployed with controlled rapid pacing. The valve is taken to 80% deployment and appropriate depth is confirmed. The valve is then released from each paddle. There is no valvular leak and no central aortic insufficiency.  The patient's hemodynamic recovery following valve deployment is good.  Echo demostrated acceptable post-procedural gradients, stable mitral valve function, and mild AI.   PROCEDURE COMPLETION:   The sheath was removed and femoral artery closure performed.  Protamine was administered once femoral arterial repair was complete. The temporary pacemaker, pigtail catheter and femoral sheaths were removed with manual pressure used for venous hemostasis.  A Mynx femoral closure device was utilized following removal of the diagnostic sheath in the right femoral artery.  The patient tolerated the procedure well and was transported to the cath lab recovery area in stable condition. There were no immediate intraoperative complications. All sponge instrument and needle counts were verified correct at completion of the operation.   No blood products were administered during the operation.  The patient received a total of 40 mL of intravenous contrast during the procedure.   Con GORMAN Clunes, MD 07/21/2024 6:02 PM

## 2024-07-21 NOTE — Transfer of Care (Signed)
 Immediate Anesthesia Transfer of Care Note  Patient: Sarah Shelton  Procedure(s) Performed: Transcatheter Aortic Valve Replacement, Transfemoral ECHOCARDIOGRAM, TRANSTHORACIC  Patient Location: Cath Lab  Anesthesia Type:MAC  Level of Consciousness: awake, alert , and oriented  Airway & Oxygen Therapy: Patient Spontanous Breathing  Post-op Assessment: Report given to RN and Post -op Vital signs reviewed and stable  Post vital signs: Reviewed and stable  Last Vitals:  Vitals Value Taken Time  BP 119/62 07/21/24 16:35  Temp    Pulse 71 07/21/24 16:35  Resp 13 07/21/24 16:35  SpO2 99 % 07/21/24 16:35  Vitals shown include unfiled device data.  Last Pain:  Vitals:   07/21/24 1343  TempSrc:   PainSc: 0-No pain         Complications: There were no known notable events for this encounter.

## 2024-07-21 NOTE — Interval H&P Note (Signed)
 History and Physical Interval Note:  07/21/2024 2:21 PM  Sarah Shelton  has presented today for surgery, with the diagnosis of Severe Aortic Stenosis.  The various methods of treatment have been discussed with the patient and family. After consideration of risks, benefits and other options for treatment, the patient has consented to  Procedure(s): Transcatheter Aortic Valve Replacement, Transfemoral (N/A) ECHOCARDIOGRAM, TRANSTHORACIC (N/A) as a surgical intervention.  The patient's history has been reviewed, patient examined, no change in status, stable for surgery.  I have reviewed the patient's chart and labs.  Questions were answered to the patient's satisfaction.     Con [name]

## 2024-07-22 ENCOUNTER — Other Ambulatory Visit (HOSPITAL_COMMUNITY): Payer: Self-pay

## 2024-07-22 ENCOUNTER — Telehealth: Payer: Self-pay | Admitting: Pharmacy Technician

## 2024-07-22 ENCOUNTER — Encounter (HOSPITAL_COMMUNITY): Payer: Self-pay | Admitting: Cardiovascular Disease

## 2024-07-22 ENCOUNTER — Inpatient Hospital Stay (HOSPITAL_COMMUNITY)

## 2024-07-22 DIAGNOSIS — Z952 Presence of prosthetic heart valve: Secondary | ICD-10-CM

## 2024-07-22 DIAGNOSIS — I350 Nonrheumatic aortic (valve) stenosis: Principal | ICD-10-CM

## 2024-07-22 LAB — BASIC METABOLIC PANEL WITH GFR
Anion gap: 13 (ref 5–15)
BUN: 14 mg/dL (ref 8–23)
CO2: 22 mmol/L (ref 22–32)
Calcium: 8 mg/dL — ABNORMAL LOW (ref 8.9–10.3)
Chloride: 103 mmol/L (ref 98–111)
Creatinine, Ser: 0.92 mg/dL (ref 0.44–1.00)
GFR, Estimated: >60 mL/min (ref >60)
Glucose, Bld: 163 mg/dL — ABNORMAL HIGH (ref 70–99)
Potassium: 3.7 mmol/L (ref 3.5–5.1)
Sodium: 138 mmol/L (ref 135–145)

## 2024-07-22 LAB — GLUCOSE, CAPILLARY
Glucose-Capillary: 143 mg/dL — ABNORMAL HIGH (ref 70–99)
Glucose-Capillary: 321 mg/dL — ABNORMAL HIGH (ref 70–99)

## 2024-07-22 LAB — POCT I-STAT, CHEM 8
BUN: 13 mg/dL (ref 8–23)
Calcium, Ion: 1.16 mmol/L (ref 1.15–1.40)
Chloride: 103 mmol/L (ref 98–111)
Creatinine, Ser: 0.8 mg/dL (ref 0.44–1.00)
Glucose, Bld: 150 mg/dL — ABNORMAL HIGH (ref 70–99)
HCT: 29 % — ABNORMAL LOW (ref 36.0–46.0)
Hemoglobin: 9.9 g/dL — ABNORMAL LOW (ref 12.0–15.0)
Potassium: 3.3 mmol/L — ABNORMAL LOW (ref 3.5–5.1)
Sodium: 141 mmol/L (ref 135–145)
TCO2: 25 mmol/L (ref 22–32)

## 2024-07-22 LAB — ECHOCARDIOGRAM COMPLETE
AR max vel: 0.99 cm2
AV Area VTI: 1.02 cm2
AV Area mean vel: 0.91 cm2
AV Mean grad: 15.6 mmHg
AV Peak grad: 26.3 mmHg
Ao pk vel: 2.57 m/s
Height: 60 in
MV VTI: 1.42 cm2
S' Lateral: 2.9 cm
Weight: 2345.69 [oz_av]

## 2024-07-22 LAB — CBC
HCT: 33.5 % — ABNORMAL LOW (ref 36.0–46.0)
Hemoglobin: 11 g/dL — ABNORMAL LOW (ref 12.0–15.0)
MCH: 32.1 pg (ref 26.0–34.0)
MCHC: 32.8 g/dL (ref 30.0–36.0)
MCV: 97.7 fL (ref 80.0–100.0)
Platelets: 136 K/uL — ABNORMAL LOW (ref 150–400)
RBC: 3.43 MIL/uL — ABNORMAL LOW (ref 3.87–5.11)
RDW: 13.7 % (ref 11.5–15.5)
WBC: 6 K/uL (ref 4.0–10.5)
nRBC: 0 % (ref 0.0–0.2)

## 2024-07-22 LAB — MAGNESIUM: Magnesium: 1.8 mg/dL (ref 1.7–2.4)

## 2024-07-22 LAB — POCT ACTIVATED CLOTTING TIME
Activated Clotting Time: 124 s
Activated Clotting Time: 227 s

## 2024-07-22 MED ORDER — PERFLUTREN LIPID MICROSPHERE
1.0000 mL | INTRAVENOUS | Status: AC | PRN
  Administered 2024-07-22: 8 mL via INTRAVENOUS

## 2024-07-22 MED ORDER — METOPROLOL SUCCINATE ER 25 MG PO TB24: 25.0000 mg | ORAL_TABLET | Freq: Two times a day (BID) | ORAL | 3 refills | Status: DC

## 2024-07-22 MED ORDER — METOPROLOL SUCCINATE ER 25 MG PO TB24
25.0000 mg | ORAL_TABLET | Freq: Every day | ORAL | Status: DC
  Administered 2024-07-22: 25 mg via ORAL
  Filled 2024-07-22: qty 1

## 2024-07-22 MED ORDER — SULFAMETHOXAZOLE-TRIMETHOPRIM 800-160 MG PO TABS: 1.0000 | ORAL_TABLET | Freq: Two times a day (BID) | ORAL | 0 refills | Status: DC

## 2024-07-22 MED ORDER — METOPROLOL SUCCINATE ER 25 MG PO TB24: 25.0000 mg | ORAL_TABLET | Freq: Every day | ORAL | Status: DC

## 2024-07-22 NOTE — Progress Notes (Addendum)
 Pt up in room, HR 140 Afib. Down to 110s with sitting. Pt feels well, sts her breathing is much improved. RN sts she hasn't had metroprolol yet. Discussed with pt restrictions, exercise, and CRPII. Pt receptive. Will refer to [facility] CRPII.  [id number] Aliene Aris BS, ACSM-CEP 07/22/2024 12:08 PM

## 2024-07-22 NOTE — Telephone Encounter (Addendum)
 Faxed eliquis  application to bms    Scanned in media    denied

## 2024-07-22 NOTE — Anesthesia Postprocedure Evaluation (Signed)
 Anesthesia Post Note  Patient: Sarah Shelton  Procedure(s) Performed: Transcatheter Aortic Valve Replacement, Transfemoral ECHOCARDIOGRAM, TRANSTHORACIC     Patient location during evaluation: PACU Anesthesia Type: MAC Level of consciousness: awake and alert Pain management: pain level controlled Vital Signs Assessment: post-procedure vital signs reviewed and stable Respiratory status: spontaneous breathing, nonlabored ventilation, respiratory function stable and patient connected to nasal cannula oxygen Cardiovascular status: stable and blood pressure returned to baseline Postop Assessment: no apparent nausea or vomiting Anesthetic complications: no   There were no known notable events for this encounter.  Last Vitals:  Vitals:   07/22/24 0343 07/22/24 0812  BP: 107/75 128/72  Pulse: 93 98  Resp: 20 16  Temp: 37.1 C 36.9 C  SpO2: 96%     Last Pain:  Vitals:   07/22/24 0812  TempSrc: Oral  PainSc:                  [name] Salwa Bai

## 2024-07-22 NOTE — Progress Notes (Signed)
 Echocardiogram 2D Echocardiogram has been performed.  Damien FALCON Yamen Castrogiovanni RDCS 07/22/2024, 10:13 AM

## 2024-07-23 ENCOUNTER — Telehealth: Payer: Self-pay

## 2024-07-23 NOTE — Telephone Encounter (Signed)
 Patient contacted regarding discharge from [facility] on 07/22/24  Patient understands to follow up with provider Dr. Verlin on 07/27/24 at 11:20AM at [address] Location. Patient understands discharge instructions? Yes Patient understands medications and regiment? Yes Patient understands to bring all medications to this visit? Yes

## 2024-07-24 ENCOUNTER — Other Ambulatory Visit (HOSPITAL_COMMUNITY): Payer: Self-pay

## 2024-07-24 ENCOUNTER — Other Ambulatory Visit (HOSPITAL_COMMUNITY)

## 2024-07-24 ENCOUNTER — Telehealth: Payer: Self-pay

## 2024-07-24 NOTE — Telephone Encounter (Signed)
 Sarah Shelton

## 2024-07-24 NOTE — Telephone Encounter (Addendum)
     Bms denied since she makes 36,000 a year and has 3 people in her house. They said she should qualify for LIS assistance. I applied LIS for her and called the patient and made her aware

## 2024-07-27 ENCOUNTER — Other Ambulatory Visit: Payer: Self-pay | Admitting: Pharmacist

## 2024-07-27 ENCOUNTER — Ambulatory Visit: Attending: Cardiovascular Disease | Admitting: Cardiovascular Disease

## 2024-07-27 ENCOUNTER — Encounter: Payer: Self-pay | Admitting: Cardiovascular Disease

## 2024-07-27 ENCOUNTER — Telehealth (HOSPITAL_COMMUNITY): Payer: Self-pay

## 2024-07-27 ENCOUNTER — Other Ambulatory Visit: Payer: Self-pay

## 2024-07-27 ENCOUNTER — Other Ambulatory Visit (HOSPITAL_COMMUNITY): Payer: Self-pay

## 2024-07-27 ENCOUNTER — Other Ambulatory Visit: Payer: Self-pay | Admitting: *Deleted

## 2024-07-27 VITALS — BP 142/68 | HR 108 | Ht 60.0 in | Wt 153.0 lb

## 2024-07-27 DIAGNOSIS — Z952 Presence of prosthetic heart valve: Secondary | ICD-10-CM

## 2024-07-27 DIAGNOSIS — I4891 Unspecified atrial fibrillation: Secondary | ICD-10-CM | POA: Diagnosis not present

## 2024-07-27 DIAGNOSIS — I5033 Acute on chronic diastolic (congestive) heart failure: Secondary | ICD-10-CM | POA: Diagnosis not present

## 2024-07-27 DIAGNOSIS — I350 Nonrheumatic aortic (valve) stenosis: Secondary | ICD-10-CM | POA: Insufficient documentation

## 2024-07-27 MED ORDER — FUROSEMIDE 20 MG PO TABS
40.0000 mg | ORAL_TABLET | Freq: Two times a day (BID) | ORAL | 1 refills | Status: DC
  Filled 2024-07-27: qty 120, 30d supply, fill #0

## 2024-07-27 MED ORDER — APIXABAN 5 MG PO TABS: 5.0000 mg | ORAL_TABLET | Freq: Two times a day (BID) | ORAL | 0 refills | Status: DC

## 2024-07-27 NOTE — Progress Notes (Signed)
 Chief Complaint  Patient presents with   Follow-up    Aortic stenosis, s/p TAVR   History of Present Illness: 78 yo female with history of severe aortic stenosis s/p recent TAVR, atrial fibrillation, HTN, HLD, DM and hypothyroidism who is here for one week post TAVR follow up. Echo in October 2025 with LVEF=40-45%, moderate MR/TR, severe AS. Cardiac cath 06/26/24 with mild CAD. She underwent TAVR on 07/21/24 with placement of a 23 mm Medtronic Evolut FX THV via the transfemoral approach. She did well following her TAVR. Echo on 07/22/24 with LVEF=55-60%. Trivial PVL. She was in rate controlled atrial fibrillation on the day of her discharge and her Eliquis  was restarted prior to discharge.   She is here today for follow up. The patient denies any chest pain, dyspnea, palpitations, dizziness, near syncope or syncope. She has ongoing LE edema. This is really her only complaint today. Otherwise she is feeling great.   Primary Care Physician: Nichole Senior, MD   Past Medical History:  Diagnosis Date   Bilateral hearing loss    wears hearing aids   Candida albicans infection    Carotid stenosis    Cellulitis of right lower extremity without foot    Chronic venous hypertension (idiopathic) with ulcer and inflammation of right lower extremity (CODE)    Diabetes mellitus without complication (HCC)    Type 2, FreeStyle Libre-sensor located on upper left arm   Diabetic neuropathy (HCC)    Diabetic polyneuropathy (HCC)    Goiter    Hyperlipidemia    Hypertension    pt denies this dx as of 12/13/21   Hyperthyroidism    Hypothyroidism    LVH (left ventricular hypertrophy)    Nonrheumatic aortic valve stenosis    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 Verlin and Dr. Daniel   Scratches    CAT   Sleep related leg cramps    Tendon pain    Ulcer of left lower extremity (HCC)    Ulcer of right  lower extremity (HCC)    Unspecified abnormalities of gait and mobility    Unsteady gait    Vasculitis    Vitamin D  deficiency    Wears dentures    full    Past Surgical History:  Procedure Laterality Date   ABDOMINAL AORTOGRAM N/A 06/26/2024   Procedure: ABDOMINAL AORTOGRAM;  Surgeon: Wendel Lurena POUR, MD;  Location: [facility] INVASIVE [facility] LAB;  Service: Cardiovascular;  Laterality: N/A;   CATARACT EXTRACTION Bilateral    HERNIA REPAIR     INTRAOPERATIVE TRANSTHORACIC ECHOCARDIOGRAM N/A 07/21/2024   Procedure: ECHOCARDIOGRAM, TRANSTHORACIC;  Surgeon: Verlin Lonni BIRCH, MD;  Location: [facility] INVAS[facility] CV LAB;  Service: Cardiovascular;  Laterality: N/A;   KENALOG  INJECTION Right 12/14/2021   Procedure: AVASTIN  INJECTION;  Surgeon: Jarold Mayo, MD;  Location: [facility] OR;  Service: Ophthalmology;  Laterality: Right;   PARS PLANA VITRECTOMY Right 12/14/2021   Procedure: PARS PLANA VITRECTOMY WITH 25 GAUGE;  Surgeon: Jarold Mayo, MD;  Location: [facility] OR;  Service: Ophthalmology;  Laterality: Right;   PHOTOCOAGULATION WITH LASER Right 12/14/2021   Procedure: PHOTOCOAGULATION WITH LASER;  Surgeon: Jarold Mayo, MD;  Location: [facility] OR;  Service: Ophthalmology;  Laterality: Right;   RIGHT/LEFT HEART CATH AND CORONARY ANGIOGRAPHY N/A 12/11/2021   Procedure: RIGHT/LEFT HEART CATH AND CORONARY ANGIOGRAPHY;  Surgeon: Wendel Lurena POUR, MD;  Location: [facility] LAB;  Service: Cardiovascular;  Laterality: N/A;   RIGHT/LEFT HEART CATH AND CORONARY ANGIOGRAPHY N/A 06/26/2024   Procedure: RIGHT/LEFT HEART CATH AND CORONARY ANGIOGRAPHY;  Surgeon: Wendel Lurena POUR, MD;  Location: [facility] INVAS[facility] CV LAB;  Service: Cardiovascular;  Laterality: N/A;    Current Outpatient Medications  Medication Sig Dispense Refill   apixaban  (ELIQUIS ) 5 MG TABS tablet Take 1 tablet (5 mg total) by mouth 2 (two) times daily. 60 tablet 0   Continuous Blood Gluc Sensor (FREESTYLE LIBRE 2 SENSOR) MISC CHANGE EVERY 14 DAYS TO MONITOR BLOOD  GLUCOSE CONTINUOUSLY     Cyanocobalamin (VITAMIN B12) 1000 MCG TBCR Take 1,000 mcg by mouth daily.     furosemide  (LASIX ) 20 MG tablet Take 2 tablets (40 mg total) by mouth 2 (two) times daily. 120 tablet 1   insulin  glargine, 1 Unit Dial, (TOUJEO SOLOSTAR) 300 UNIT/ML Solostar Pen Inject 14 Units into the skin at bedtime.     levothyroxine  (SYNTHROID ) 125 MCG tablet Take 125 mcg by mouth daily before breakfast.     metoprolol  succinate (TOPROL  XL) 25 MG 24 hr tablet Take 1 tablet (25 mg total) by mouth 2 (two) times daily. 60 tablet 3   NOVOLOG  FLEXPEN 100 UNIT/ML FlexPen Inject 6 Units into the skin 3 (three) times daily with meals.     sulfamethoxazole -trimethoprim  (BACTRIM  DS) 800-160 MG tablet Take 1 tablet by mouth 2 (two) times daily. 6 tablet 0   TURMERIC PO Take 500 mg by mouth daily.     Zinc  50 MG TABS Take 50 mg by mouth daily.     apixaban  (ELIQUIS ) 5 MG TABS tablet Take 1 tablet (5 mg total) by mouth 2 (two) times daily. 56 tablet 0   No current facility-administered medications for this visit.    Allergies  Allergen Reactions   Shellfish Allergy Hives    Patient states she ate shrimp years ago tongue and face swelled, broke out into hives. Had to go to emergency room.   Adhesive [Tape] Rash    Reaction to plastic bandages - fabric bandages ok Paper Tape is okay to use.   Cortisone Rash    Topical cortisone    Social History   Socioeconomic History   Marital status: Single    Spouse name: Not on file   Number of children: Not on file   Years of education: Not on file   Highest education level: Not on file  Occupational History   Not on file  Tobacco Use   Smoking status: Never   Smokeless tobacco: Never  Vaping Use   Vaping status: Never Used  Substance and Sexual Activity   Alcohol  use: Never   Drug use: Never   Sexual activity: Not Currently    Birth control/protection: Post-menopausal  Other Topics Concern   Not on file  Social History Narrative   Not  on file   Social Drivers of Health   Financial Resource Strain: Not on file  Food Insecurity: No Food Insecurity (07/21/2024)   Hunger Vital Sign    Worried About Running Out of Food in the Last Year: Never true    Ran Out of Food in the Last Year: Never true  Transportation Needs: No Transportation Needs (07/21/2024)   PRAPARE - Administrator, Civil Service (Medical): No    Lack of Transportation (Non-Medical): No  Physical Activity: Not on file  Stress: Not on file  Social Connections: Moderately Integrated (07/21/2024)   Social Connection and Isolation Panel    Frequency of Communication  with Friends and Family: More than three times a week    Frequency of Social Gatherings with Friends and Family: Twice a week    Attends Religious Services: More than 4 times per year    Active Member of [employer] or Organizations: Yes    Attends Banker Meetings: 1 to 4 times per year    Marital Status: Never married  Intimate Partner Violence: Not At Risk (07/21/2024)   Humiliation, Afraid, Rape, and Kick questionnaire    Fear of Current or Ex-Partner: No    Emotionally Abused: No    Physically Abused: No    Sexually Abused: No    Family History  Problem Relation Age of Onset   Thyroid  cancer Mother    CAD Mother 48       1972   Diabetes Father     Review of Systems:  As stated in the HPI and otherwise negative.   BP (!) 142/68   Pulse (!) 108   Ht 5' (1.524 m)   Wt 153 lb (69.4 kg)   SpO2 100%   BMI 29.88 kg/m   Physical Examination: General: Well developed, well nourished, NAD  HEENT: OP clear, mucus membranes moist  SKIN: warm, dry. No rashes. Neuro: No focal deficits  Musculoskeletal: Muscle strength 5/5 all ext  Psychiatric: Mood and affect normal  Neck: No JVD, no carotid bruits, no thyromegaly, no lymphadenopathy.  Lungs:Clear bilaterally, no wheezes, rhonci, crackles Cardiovascular: irreg irreg. Soft systolic murmur.  Abdomen:Soft. Bowel  sounds present. Non-tender.  Extremities: 1-2 + bilateral lower extremity edema.   EKG:  EKG is ordered today. The ekg ordered today demonstrates  Atrial fib, 108 bpm  Echo 07/22/24:   1. Left ventricular ejection fraction, by estimation, is 55 to 60%. The  left ventricle has normal function. The left ventricle has no regional  wall motion abnormalities. There is mild concentric left ventricular  hypertrophy of the septal segment. Left  ventricular diastolic parameters are indeterminate.   2. Right ventricular systolic function is low normal. The right  ventricular size is mildly enlarged. There is moderately elevated  pulmonary artery systolic pressure. The estimated right ventricular  systolic pressure is 45.7 mmHg.   3. Left atrial size was severely dilated.   4. Right atrial size was moderately dilated.   5. The mitral valve is degenerative. Mild mitral valve regurgitation.  Moderate mitral stenosis. The mean mitral valve gradient is 6.3 mmHg with  average heart rate of 97 bpm. Moderate mitral annular calcification.   6. The tricuspid valve is degenerative. Tricuspid valve regurgitation is  moderate to severe.   7. Trivial perivalvular leak at the 4 o'clock position. The aortic valve  has been repaired/replaced. Aortic valve regurgitation is trivial. There  is a 23 mm Medtronic stented (TAVR) valve present in the aortic position.  Echo findings are consistent with   normal structure and function of the aortic valve prosthesis. Aortic  valve area, by VTI measures 1.02 cm. Aortic valve mean gradient measures  15.6 mmHg. Aortic valve Vmax measures 2.57 m/s.   8. The inferior vena cava is dilated in size with <50% respiratory  variability, suggesting right atrial pressure of 15 mmHg.   Recent Labs: 07/21/2024: ALT 20 07/22/2024: BUN 14; Creatinine, Ser 0.92; Hemoglobin 11.0; Magnesium  1.8; Platelets 136; Potassium 3.7; Sodium 138   Lipid Panel    Component Value Date/Time    CHOL 182 10/30/2021 1524   TRIG 121 10/30/2021 1524   HDL 63 10/30/2021 1524  CHOLHDL 2.9 10/30/2021 1524   LDLCALC 98 10/30/2021 1524     Wt Readings from Last 3 Encounters:  07/27/24 153 lb (69.4 kg)  07/22/24 146 lb 9.7 oz (66.5 kg)  07/03/24 140 lb (63.5 kg)    Assessment and Plan:   1. Severe aortic stenosis s/p TAVR: She is doing well today post TAVR last week. Both groins are soft without hematoma. She feels well. No complaints today. She is on Eliquis  with recent finding of atrial fibrillation. She is instructed to use SBE prophylaxis as indicated. Echo in one month.   2. Atrial fibrillation, persistent: Rate is in the low 100s. Will increase Toprol  to 50 mg per day. Continue Eliquis . Pharmacy assistance for Eliquis  today. If she cannot afford, will change to coumadin at one month follow up visit. After one month of anti-coagulation, consider DCCV if she remains in atrial fib.   3. Chronic diastolic CHF: She continues to have LE edema likely related to chronic diastolic dysfunction and worsened with atrial fib.  LVEDP 17 mmHg during TAVR.  Will increase Lasix  to 40 mg po BID for 2 weeks. BMET in 10 days.   Labs/ tests ordered today include:   Orders Placed This Encounter  Procedures   Basic Metabolic Panel (BMET)   EKG 12-Lead   Disposition:   F/U with [name], PA-C in one month  Signed, Lonni Cash, MD, [facility] 07/27/2024 11:36 AM    [facility] [address] [place], [place], [place]  [address] Phone: [phone]; Fax: [phone]

## 2024-07-27 NOTE — Telephone Encounter (Signed)
 I called the patient and made her aware of the samples. We tried to check the status of her application online with LIS and they are requiring authentication. She is just going to wait on the letter of decision -which is what is needed for bms anyways. She will call me once she receives the letter

## 2024-07-27 NOTE — Telephone Encounter (Signed)
Pt is not interested in the cardiac rehab program. Closed referral 

## 2024-07-27 NOTE — Patient Instructions (Signed)
 Medication Instructions:  Your physician has recommended you make the following change in your medication:  Increase furosemide  to 40 mg twice daily .  This will be 2 of the 20 mg tablets twice daily   *If you need a refill on your cardiac medications before your next appointment, please call your pharmacy*  Lab Work: Your physician recommends that you return for lab work in: 10 days.  BMP.  This is not fasting.  Can be done at any [facility] location.  There is a [facility] on the first floor of our building  If you have labs (blood work) drawn today and your tests are completely normal, you will receive your results only by: MyChart Message (if you have MyChart) OR A paper copy in the mail If you have any lab test that is abnormal or we need to change your treatment, we will call you to review the results.  Testing/Procedures: Your physician has requested that you have an echocardiogram. Scheduled for 12/18 Echocardiography is a painless test that uses sound waves to create images of your heart. It provides your doctor with information about the size and shape of your heart and how well your heart's chambers and valves are working. This procedure takes approximately one hour. There are no restrictions for this procedure. Please do NOT wear cologne, perfume, aftershave, or lotions (deodorant is allowed). Please arrive 15 minutes prior to your appointment time.  Please note: We ask at that you not bring children with you during ultrasound (echo/ vascular) testing. Due to room size and safety concerns, children are not allowed in the ultrasound rooms during exams. Our front office staff cannot provide observation of children in our lobby area while testing is being conducted. An adult accompanying a patient to their appointment will only be allowed in the ultrasound room at the discretion of the ultrasound technician under special circumstances. We apologize for any inconvenience.   Follow-Up: At  [facility], you and your health needs are our priority.  As part of our continuing mission to provide you with exceptional heart care, our providers are all part of one team.  This team includes your primary Cardiologist (physician) and Advanced Practice Providers or APPs (Physician Assistants and Nurse Practitioners) who all work together to provide you with the care you need, when you need it.  Your next appointment:   12/18  Provider:   [name], PA-C    We recommend signing up for the patient portal called MyChart.  Sign up information is provided on this After Visit Summary.  MyChart is used to connect with patients for Virtual Visits (Telemedicine).  Patients are able to view lab/test results, encounter notes, upcoming appointments, etc.  Non-urgent messages can be sent to your provider as well.   To learn more about what you can do with MyChart, go to [email or web address].   Other Instructions

## 2024-07-28 ENCOUNTER — Other Ambulatory Visit (HOSPITAL_COMMUNITY): Payer: Self-pay

## 2024-07-29 ENCOUNTER — Ambulatory Visit: Admitting: Internal Medicine

## 2024-08-05 DIAGNOSIS — I350 Nonrheumatic aortic (valve) stenosis: Secondary | ICD-10-CM | POA: Diagnosis not present

## 2024-08-05 DIAGNOSIS — Z952 Presence of prosthetic heart valve: Secondary | ICD-10-CM | POA: Diagnosis not present

## 2024-08-06 ENCOUNTER — Encounter (HOSPITAL_COMMUNITY): Payer: Self-pay | Admitting: Cardiovascular Disease

## 2024-08-06 ENCOUNTER — Ambulatory Visit: Payer: Self-pay | Admitting: Cardiovascular Disease

## 2024-08-06 ENCOUNTER — Other Ambulatory Visit (HOSPITAL_COMMUNITY): Payer: Self-pay

## 2024-08-06 LAB — BASIC METABOLIC PANEL WITH GFR
BUN/Creatinine Ratio: 16 (ref 12–28)
BUN: 18 mg/dL (ref 8–27)
CO2: 29 mmol/L (ref 20–29)
Calcium: 9.1 mg/dL (ref 8.7–10.3)
Chloride: 98 mmol/L (ref 96–106)
Creatinine, Ser: 1.12 mg/dL — ABNORMAL HIGH (ref 0.57–1.00)
Glucose: 133 mg/dL — ABNORMAL HIGH (ref 70–99)
Potassium: 4.6 mmol/L (ref 3.5–5.2)
Sodium: 140 mmol/L (ref 134–144)
eGFR: 50 mL/min/1.73 — ABNORMAL LOW (ref >59)

## 2024-08-06 MED ORDER — FUROSEMIDE 20 MG PO TABS
40.0000 mg | ORAL_TABLET | Freq: Every day | ORAL | 2 refills | Status: DC
  Filled 2024-08-06: qty 180, 90d supply, fill #0

## 2024-08-06 NOTE — Telephone Encounter (Signed)
 Pt returning call. Please advise.

## 2024-08-06 NOTE — Addendum Note (Signed)
 Addendum  created 08/06/24 1106 by Azan Maneri, [name], DO   Intraprocedure Event edited, Intraprocedure Staff edited

## 2024-08-07 NOTE — Telephone Encounter (Signed)
 I spoke to the patient and she said she got a letter from ss and she needs to provide more info. She will call me and we will look at together

## 2024-08-14 NOTE — Telephone Encounter (Signed)
 I called the patient and she will call me back once she gets her paperwork together for lis/extra help

## 2024-08-19 ENCOUNTER — Other Ambulatory Visit (HOSPITAL_COMMUNITY): Payer: Self-pay | Admitting: Radiology

## 2024-08-20 ENCOUNTER — Ambulatory Visit (HOSPITAL_COMMUNITY)

## 2024-08-20 ENCOUNTER — Ambulatory Visit: Admitting: Physician Assistant

## 2024-08-20 ENCOUNTER — Other Ambulatory Visit: Payer: Self-pay | Admitting: Physician Assistant

## 2024-08-20 VITALS — BP 140/80 | HR 117

## 2024-08-20 DIAGNOSIS — I4819 Other persistent atrial fibrillation: Secondary | ICD-10-CM

## 2024-08-20 DIAGNOSIS — I5022 Chronic systolic (congestive) heart failure: Secondary | ICD-10-CM | POA: Insufficient documentation

## 2024-08-20 DIAGNOSIS — I5020 Unspecified systolic (congestive) heart failure: Secondary | ICD-10-CM

## 2024-08-20 DIAGNOSIS — I10 Essential (primary) hypertension: Secondary | ICD-10-CM

## 2024-08-20 DIAGNOSIS — Z952 Presence of prosthetic heart valve: Secondary | ICD-10-CM

## 2024-08-20 LAB — ECHOCARDIOGRAM COMPLETE
AV Mean grad: 9 mmHg
AV Peak grad: 15.1 mmHg
Ao pk vel: 1.94 m/s
S' Lateral: 3.1 cm

## 2024-08-20 MED ORDER — FUROSEMIDE 20 MG PO TABS: 20.0000 mg | ORAL_TABLET | Freq: Two times a day (BID) | ORAL | 2 refills | Status: AC

## 2024-08-20 NOTE — H&P (View-Only) (Signed)
 " HEART AND VASCULAR CENTER   MULTIDISCIPLINARY HEART VALVE CLINIC                                     Cardiology Office Note:    Date:  08/21/2024   ID:  Sarah Shelton, DOB 11/10/1945, MRN [id number]  PCP:  Nichole Senior, MD  [facility] Cardiologist:  Wilbert Bihari, MD  [facility] Structural heart: Lonni Cash, MD [facility] Electrophysiologist:  None   Referring MD: Nichole Senior, MD   1 month s/p TAVR  History of Present Illness:    Sarah Shelton is a 78 y.o. 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   Sarah Shelton has been followed over time for severe AS. She recently developed heart failure symptoms and new LV dysfunction. Echo 06/12/24 showed EF 40-45%, mod pulm HTN, mod MAC with mod MR/TR, severe AS with mean grad 41 mmHg, AVA 0.44 cm2, and mild AI. [facility] 06/26/24 showed mild non obst CAD. She was seen in the office on 06/18/24 for structural heart consultation and noted to have new onset afib. She was started on Eliquis  5mg  BID and Toprol  XL 25mg  daily. She underwent TAVR on 07/21/24 with placement of a 23 mm Medtronic Evolut FX THV via the transfemoral approach. She did well following her TAVR. Echo on 07/22/24 with LVEF 55-60%. Trivial PVL. She was in rate controlled atrial fibrillation on the day of her discharge and her Eliquis  was restarted prior to discharge. She has worsening LE edema and post hospital follow up and Lasix  increased.   Today the patient presents to clinic for follow up. LE edema is much improved. Hasn't seen her ankles in months. Has to go buy new shoes. She is doing great so happy. She likes to be active and is so excited to be feeling better. No CP or SOB. No LE edema, orthopnea or PND. No dizziness or syncope. No blood in stool or urine. No palpitations except when HR gets very high.    Past Medical History:  Diagnosis  Date   Bilateral hearing loss    wears hearing aids   Candida albicans infection    Carotid stenosis    Cellulitis of right lower extremity without foot    Chronic venous hypertension (idiopathic) with ulcer and inflammation of right lower extremity (CODE)    Diabetes mellitus without complication (HCC)    Type 2, FreeStyle Libre-sensor located on upper left arm   Diabetic neuropathy (HCC)    Diabetic polyneuropathy (HCC)    Goiter    Hyperlipidemia    Hypertension    pt denies this dx as of 12/13/21   Hyperthyroidism    Hypothyroidism    LVH (left ventricular hypertrophy)    Nonrheumatic aortic valve stenosis    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 Cash and Dr. Daniel   Scratches    CAT   Sleep related leg cramps    Tendon pain    Ulcer of left lower extremity (HCC)    Ulcer of right lower extremity (HCC)    Unspecified abnormalities of gait and mobility    Unsteady gait    Vasculitis    Vitamin  D deficiency    Wears dentures    full     Current Medications: Active Medications[1]    ROS:   Please see the history of present illness.    All other systems reviewed and are negative.  EKGs   EKG Interpretation Date/Time:  Thursday August 20 2024 15:35:30 EST Ventricular Rate:  117 PR Interval:    QRS Duration:  96 QT Interval:  328 QTC Calculation: 457 R Axis:   26  Text Interpretation: Atrial fibrillation with rapid ventricular response Anterior infarct (cited on or before 18-Jun-2024) ST & T wave abnormality, consider lateral ischemia When compared with ECG of 27-Jul-2024 10:51, Questionable change in initial forces of Septal leads Confirmed by Sebastian Collar [phone]) on 08/21/2024 9:59:26 AM   Risk Assessment/Calculations:    CHA2DS2-VASc Score =     This indicates a  % annual risk of stroke. The patient's score is based upon:            Physical  Exam:    VS:  BP (!) 140/80   Pulse (!) 117     Wt Readings from Last 3 Encounters:  07/27/24 153 lb (69.4 kg)  07/22/24 146 lb 9.7 oz (66.5 kg)  07/03/24 140 lb (63.5 kg)     GEN: Well nourished, well developed in no acute distress NECK: No JVD CARDIAC: irreg irreg, tachy. No murmurs, rubs, gallops RESPIRATORY:  Clear to auscultation without rales, wheezing or rhonchi  ABDOMEN: Soft, non-tender, non-distended EXTREMITIES:  No edema; No deformity.    ASSESSMENT:    1. S/P TAVR (transcatheter aortic valve replacement)   2. Persistent atrial fibrillation (HCC)   3. Chronic heart failure with mildly reduced ejection fraction (HFmrEF, 41-49%) (HCC)   4. Primary hypertension     PLAN:    In order of problems listed above:  Severe AS s/p TAVR:  -- Echo today shows EF 40-45%, mild cLVH, mild RVE, mod pulm HTN, mild-mod MR, mod TR, and normally functioning TAVR with a mean gradient of 9 mm hg and no PVL.  -- NYHA class I symptoms with marked improvement since TAVR. -- Continue Eliquis  5mg  BID.  -- SBE prophylaxis discussed; the patient is edentulous and does not go to the dentist.  -- I will see back for 1 year office visit with echo.  Atrial fibrillation with RVR: -- Recently diagnosed during structural heart office visit. -- Started on Eliquis  5mg  BID and working with pharmacy to get patient assistance.   -- Rate has been difficult to control and EF down a little. Continue Toprol  XL 25mg  BID and set up for DCCV tomorrow with Dr. Mona. -- She confirmed she has not missed any doses of Eliquis .  -- BMET/CBC/ECG for pre procedure labs.    HFmrEF: -- EF 40-45%. -- Appears euvolemic.  -- Continue Lasix  20mg  BID.    HTN: -- BP well controlled.  -- Continue Lasix  20mg  BID and Toprol  XL 25mg  BID.   Informed Consent   Shared Decision Making/Informed Consent The risks (stroke, cardiac arrhythmias rarely resulting in the need for a temporary or permanent pacemaker, skin  irritation or burns and complications associated with conscious sedation including aspiration, arrhythmia, respiratory failure and death), benefits (restoration of normal sinus rhythm) and alternatives of a direct current cardioversion were explained in detail to Sarah. Shelton and she agrees to proceed.   Medication Adjustments/Labs and Tests Ordered: Current medicines are reviewed at length with the patient today.  Concerns regarding medicines are outlined above.  Orders  Placed This Encounter  Procedures   Basic Metabolic Panel (BMET)   CBC   Confirm CBC and BMP (or CMP) results within 7 days for inpatient and 30 days for outpatient:   If patient is on warfarin (COUMADIN), place order for and obtain PT-INR and document INR is between 2-3   EKG 12-Lead   ECHOCARDIOGRAM COMPLETE   Meds ordered this encounter  Medications   furosemide  (LASIX ) 20 MG tablet    Sig: Take 1 tablet (20 mg total) by mouth 2 (two) times daily.    Dispense:  180 tablet    Refill:  2    Dose change.    Patient Instructions  Medication Instructions:  Your physician has recommended you make the following change in your medication:  CHANGE Lasix  to 20mg  twice a day (instead of 40mg  once a day) STOP Bactrim  (course completed)  *If you need a refill on your cardiac medications before your next appointment, please call your pharmacy*  Lab Work: TODAY BMET and CBC If you have labs (blood work) drawn today and your tests are completely normal, you will receive your results only by: MyChart Message (if you have MyChart) OR A paper copy in the mail If you have any lab test that is abnormal or we need to change your treatment, we will call you to review the results.  Testing/Procedures: You are scheduled for a cardioversion tomorrow, 08/21/24. Arrive at [facility] entrance A at 10:30. Please see the attached instructions.  Follow-Up: At [facility], you and your health needs are our priority.  As part  of our continuing mission to provide you with exceptional heart care, our providers are all part of one team.  This team includes your primary Cardiologist (physician) and Advanced Practice Providers or APPs (Physician Assistants and Nurse Practitioners) who all work together to provide you with the care you need, when you need it.  Your next appointment:   As scheduled for 09/01/24  Provider:   Aline Door, PA-C  We recommend signing up for the patient portal called MyChart.  Sign up information is provided on this After Visit Summary.  MyChart is used to connect with patients for Virtual Visits (Telemedicine).  Patients are able to view lab/test results, encounter notes, upcoming appointments, etc.  Non-urgent messages can be sent to your provider as well.   To learn more about what you can do with MyChart, go to [email or web address].            Signed, Lamarr Hummer, PA-C  08/21/2024 10:00 AM    [facility]     [1]  Current Meds  Medication Sig   Continuous Blood Gluc Sensor (FREESTYLE LIBRE 2 SENSOR) MISC CHANGE EVERY 14 DAYS TO MONITOR BLOOD GLUCOSE CONTINUOUSLY   Cyanocobalamin (VITAMIN B12) 1000 MCG TBCR Take 1,000 mcg by mouth daily.   insulin  glargine, 1 Unit Dial, (TOUJEO SOLOSTAR) 300 UNIT/ML Solostar Pen Inject 14 Units into the skin at bedtime.   levothyroxine  (SYNTHROID ) 125 MCG tablet Take 125 mcg by mouth daily before breakfast.   NOVOLOG  FLEXPEN 100 UNIT/ML FlexPen Inject 6 Units into the skin 3 (three) times daily with meals.   TURMERIC PO Take 500 mg by mouth daily.   Zinc  50 MG TABS Take 50 mg by mouth daily.   [DISCONTINUED] furosemide  (LASIX ) 20 MG tablet Take 2 tablets (40 mg total) by mouth daily.   "

## 2024-08-20 NOTE — Progress Notes (Unsigned)
 " HEART AND VASCULAR CENTER   MULTIDISCIPLINARY HEART VALVE CLINIC                                     Cardiology Office Note:    Date:  08/21/2024   ID:  Sarah Shelton, DOB 11/10/1945, MRN [id number]  PCP:  Sarah Senior, MD  [facility] Cardiologist:  Sarah Bihari, MD  [facility] Structural heart: Sarah Cash, MD [facility] Electrophysiologist:  None   Referring MD: Sarah Senior, MD   1 month s/p TAVR  History of Present Illness:    Sarah Shelton is a 78 y.o. 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   [name] has been followed over time for severe AS. She recently developed heart failure symptoms and new LV dysfunction. Echo 06/12/24 showed EF 40-45%, mod pulm HTN, mod MAC with mod MR/TR, severe AS with mean grad 41 mmHg, AVA 0.44 cm2, and mild AI. [facility] 06/26/24 showed mild non obst CAD. She was seen in the office on 06/18/24 for structural heart consultation and noted to have new onset afib. She was started on Eliquis  5mg  BID and Toprol  XL 25mg  daily. She underwent TAVR on 07/21/24 with placement of a 23 mm Medtronic Evolut FX THV via the transfemoral approach. She did well following her TAVR. Echo on 07/22/24 with LVEF 55-60%. Trivial PVL. She was in rate controlled atrial fibrillation on the day of her discharge and her Eliquis  was restarted prior to discharge. She has worsening LE edema and post hospital follow up and Lasix  increased.   Today the patient presents to clinic for follow up. LE edema is much improved. Hasn't seen her ankles in months. Has to go buy new shoes. She is doing great so happy. She likes to be active and is so excited to be feeling better. No CP or SOB. No LE edema, orthopnea or PND. No dizziness or syncope. No blood in stool or urine. No palpitations except when HR gets very high.    Past Medical History:  Diagnosis  Date   Bilateral hearing loss    wears hearing aids   Candida albicans infection    Carotid stenosis    Cellulitis of right lower extremity without foot    Chronic venous hypertension (idiopathic) with ulcer and inflammation of right lower extremity (CODE)    Diabetes mellitus without complication (HCC)    Type 2, FreeStyle Libre-sensor located on upper left arm   Diabetic neuropathy (HCC)    Diabetic polyneuropathy (HCC)    Goiter    Hyperlipidemia    Hypertension    pt denies this dx as of 12/13/21   Hyperthyroidism    Hypothyroidism    LVH (left ventricular hypertrophy)    Nonrheumatic aortic valve stenosis    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 Cash and Dr. Daniel   Scratches    CAT   Sleep related leg cramps    Tendon pain    Ulcer of left lower extremity (HCC)    Ulcer of right lower extremity (HCC)    Unspecified abnormalities of gait and mobility    Unsteady gait    Vasculitis    Vitamin  All other systems reviewed and are negative.  EKGs       Risk Assessment/Calculations:    CHA2DS2-VASc Score =    {Click here to calculate score.  REFRESH note before signing. :1} This indicates a  % annual risk of stroke. The patient's score is based upon:        {This patient may be at risk for Amyloid. She has one or more dx on the prob list or PMH from the following list -  Abnormal EKG, HFpEF/Diastolic CHF, Aortic Stenosis, LVH, Bilateral Carpal Tunnel Syndrome, Biceps Tendon Rupture, Spinal Stenosis, Pericardial Effusion, Left Atrial Enlargement, Conduction System Disorder. See list below or review PMH.  Diagnoses From Problem List           Noted     Diabetic polyneuropathy (HCC) Unknown     Nonrheumatic aortic valve stenosis Unknown    Click HERE to open Cardiac Amyloid Screening SmartSet to order screening OR Click HERE to defer testing for 1 year or permanently :1}    Physical  Exam:    VS:  There were no vitals taken for this visit.    Wt Readings from Last 3 Encounters:  07/27/24 153 lb (69.4 kg)  07/22/24 146 lb 9.7 oz (66.5 kg)  07/03/24 140 lb (63.5 kg)     GEN: Well nourished, well developed in no acute distress NECK: No JVD CARDIAC: ***RRR, no murmurs, rubs, gallops RESPIRATORY:  Clear to auscultation without rales, wheezing or rhonchi  ABDOMEN: Soft, non-tender, non-distended EXTREMITIES:  No edema; No deformity.    ASSESSMENT:    1. S/P TAVR (transcatheter aortic valve replacement)   2. Persistent atrial fibrillation (HCC)   3. Heart failure with improved ejection fraction (HFimpEF) (HCC)   4. Primary hypertension     PLAN:    In order of problems listed above:  Severe AS s/p TAVR:  -- Echo today shows EF ***, normally functioning TAVR with a mean gradient of *** mm hg and *** PVL.  -- NYHA class *** symptoms.  -- Continue ***.  -- SBE*** -- I will see back for 1 month/year office visit with echo.  Atrial fibrillation with RVR: -- Recently diagnosed during structural heart office visit -- Started on Eliquis  5mg  BID but this is unaffordable to her. I reached out our pharmacy technicians and helped her fill out forms for patient assistance.  -- Rate well controlled on Toprol  XL 25mg  BID. -- If she remains in afib at her 1 month appt, I will get her set up for DCCV. Discussed the importance of not missing any doses of Eliquis .      HFimpEF: -- LVEDP 17 mm hg at the time of TAVR. -- Appears euvolemic.  -- Continue Lasix  20mg  BID. *** 40mg  BID.    HTN: -- BP well controlled.  -- Resume home meds    Medication Adjustments/Labs and Tests Ordered: Current medicines are reviewed at length with the patient today.  Concerns regarding medicines are outlined above.  No orders of the defined types were placed in this encounter.  No orders of the defined types were placed in this encounter.   There are no Patient Instructions on file  for this visit.   Signed, Lamarr Hummer, PA-C  08/20/2024 11:04 AM    [facility]     [1]  No outpatient medications have been marked as taking for the 08/20/24 encounter (Appointment) with Hummer Lamarr SAUNDERS, PA-C.

## 2024-08-20 NOTE — Patient Instructions (Signed)
 Medication Instructions:  Your physician has recommended you make the following change in your medication:  CHANGE Lasix  to 20mg  twice a day (instead of 40mg  once a day) STOP Bactrim  (course completed)  *If you need a refill on your cardiac medications before your next appointment, please call your pharmacy*  Lab Work: TODAY BMET and CBC If you have labs (blood work) drawn today and your tests are completely normal, you will receive your results only by: MyChart Message (if you have MyChart) OR A paper copy in the mail If you have any lab test that is abnormal or we need to change your treatment, we will call you to review the results.  Testing/Procedures: You are scheduled for a cardioversion tomorrow, 08/21/24. Arrive at [facility] entrance A at 10:30. Please see the attached instructions.  Follow-Up: At [facility], you and your health needs are our priority.  As part of our continuing mission to provide you with exceptional heart care, our providers are all part of one team.  This team includes your primary Cardiologist (physician) and Advanced Practice Providers or APPs (Physician Assistants and Nurse Practitioners) who all work together to provide you with the care you need, when you need it.  Your next appointment:   As scheduled for 09/01/24  Provider:   Aline Door, PA-C  We recommend signing up for the patient portal called MyChart.  Sign up information is provided on this After Visit Summary.  MyChart is used to connect with patients for Virtual Visits (Telemedicine).  Patients are able to view lab/test results, encounter notes, upcoming appointments, etc.  Non-urgent messages can be sent to your provider as well.   To learn more about what you can do with MyChart, go to [email or web address].

## 2024-08-21 ENCOUNTER — Other Ambulatory Visit: Payer: Self-pay

## 2024-08-21 ENCOUNTER — Encounter (HOSPITAL_COMMUNITY): Admission: RE | Disposition: A | Payer: Self-pay | Attending: Internal Medicine

## 2024-08-21 ENCOUNTER — Ambulatory Visit (HOSPITAL_COMMUNITY): Admitting: Anesthesiology

## 2024-08-21 ENCOUNTER — Ambulatory Visit (HOSPITAL_COMMUNITY)
Admission: RE | Admit: 2024-08-21 | Discharge: 2024-08-21 | Disposition: A | Discharge status: Home / Self Care | Attending: Internal Medicine | Admitting: Internal Medicine

## 2024-08-21 ENCOUNTER — Ambulatory Visit: Payer: Self-pay | Admitting: Physician Assistant

## 2024-08-21 DIAGNOSIS — I5022 Chronic systolic (congestive) heart failure: Secondary | ICD-10-CM | POA: Insufficient documentation

## 2024-08-21 DIAGNOSIS — E119 Type 2 diabetes mellitus without complications: Secondary | ICD-10-CM | POA: Diagnosis not present

## 2024-08-21 DIAGNOSIS — Z974 Presence of external hearing-aid: Secondary | ICD-10-CM | POA: Insufficient documentation

## 2024-08-21 DIAGNOSIS — I10 Essential (primary) hypertension: Secondary | ICD-10-CM

## 2024-08-21 DIAGNOSIS — E1142 Type 2 diabetes mellitus with diabetic polyneuropathy: Secondary | ICD-10-CM | POA: Diagnosis not present

## 2024-08-21 DIAGNOSIS — Z953 Presence of xenogenic heart valve: Secondary | ICD-10-CM | POA: Insufficient documentation

## 2024-08-21 DIAGNOSIS — Z79899 Other long term (current) drug therapy: Secondary | ICD-10-CM | POA: Insufficient documentation

## 2024-08-21 DIAGNOSIS — Z7901 Long term (current) use of anticoagulants: Secondary | ICD-10-CM | POA: Insufficient documentation

## 2024-08-21 DIAGNOSIS — Z794 Long term (current) use of insulin: Secondary | ICD-10-CM | POA: Diagnosis not present

## 2024-08-21 DIAGNOSIS — E039 Hypothyroidism, unspecified: Secondary | ICD-10-CM

## 2024-08-21 DIAGNOSIS — H9193 Unspecified hearing loss, bilateral: Secondary | ICD-10-CM | POA: Diagnosis not present

## 2024-08-21 DIAGNOSIS — I110 Hypertensive heart disease with heart failure: Secondary | ICD-10-CM | POA: Insufficient documentation

## 2024-08-21 DIAGNOSIS — I4891 Unspecified atrial fibrillation: Secondary | ICD-10-CM

## 2024-08-21 DIAGNOSIS — I4819 Other persistent atrial fibrillation: Secondary | ICD-10-CM | POA: Insufficient documentation

## 2024-08-21 DIAGNOSIS — I2510 Atherosclerotic heart disease of native coronary artery without angina pectoris: Secondary | ICD-10-CM | POA: Diagnosis not present

## 2024-08-21 DIAGNOSIS — I272 Pulmonary hypertension, unspecified: Secondary | ICD-10-CM | POA: Diagnosis not present

## 2024-08-21 HISTORY — PX: CARDIOVERSION: EP1203

## 2024-08-21 LAB — CBC
Hematocrit: 35.9 % (ref 34.0–46.6)
Hemoglobin: 11.6 g/dL (ref 11.1–15.9)
MCH: 31.8 pg (ref 26.6–33.0)
MCHC: 32.3 g/dL (ref 31.5–35.7)
MCV: 98 fL — ABNORMAL HIGH (ref 79–97)
Platelets: 142 x10E3/uL — ABNORMAL LOW (ref 150–450)
RBC: 3.65 x10E6/uL — ABNORMAL LOW (ref 3.77–5.28)
RDW: 12.4 % (ref 11.7–15.4)
WBC: 5.7 x10E3/uL (ref 3.4–10.8)

## 2024-08-21 LAB — BASIC METABOLIC PANEL WITH GFR
BUN/Creatinine Ratio: 19 (ref 12–28)
BUN: 19 mg/dL (ref 8–27)
CO2: 22 mmol/L (ref 20–29)
Calcium: 8.8 mg/dL (ref 8.7–10.3)
Chloride: 103 mmol/L (ref 96–106)
Creatinine, Ser: 1.01 mg/dL — AB (ref 0.57–1.00)
Glucose: 141 mg/dL — AB (ref 70–99)
Sodium: 140 mmol/L (ref 134–144)
eGFR: 57 mL/min/1.73 — AB

## 2024-08-21 LAB — GLUCOSE, CAPILLARY: Glucose-Capillary: 161 mg/dL — ABNORMAL HIGH (ref 70–99)

## 2024-08-21 SURGERY — CARDIOVERSION (CATH LAB)
Anesthesia: General

## 2024-08-21 MED ORDER — PROPOFOL 10 MG/ML IV BOLUS
INTRAVENOUS | Status: DC | PRN
  Administered 2024-08-21: 50 mg via INTRAVENOUS

## 2024-08-21 MED ORDER — LIDOCAINE 2% (20 MG/ML) 5 ML SYRINGE
INTRAMUSCULAR | Status: DC | PRN
  Administered 2024-08-21: 60 mg via INTRAVENOUS

## 2024-08-21 MED ORDER — SODIUM CHLORIDE 0.9 % IV SOLN: INTRAVENOUS | Status: DC

## 2024-08-21 MED ORDER — APIXABAN 5 MG PO TABS
ORAL_TABLET | ORAL | Status: AC
  Administered 2024-08-21: 5 mg
  Filled 2024-08-21: qty 1

## 2024-08-21 SURGICAL SUPPLY — 1 items: PAD DEFIB RADIO PHYSIO CONN (PAD) ×1 IMPLANT

## 2024-08-21 NOTE — Transfer of Care (Signed)
 Immediate Anesthesia Transfer of Care Note  Patient: Sarah Shelton  Procedure(s) Performed: CARDIOVERSION  Patient Location: Cath Lab  Anesthesia Type:General  Level of Consciousness: drowsy and responds to stimulation  Airway & Oxygen Therapy: Patient Spontanous Breathing and Patient connected to nasal cannula oxygen  Post-op Assessment: Report given to RN and Post -op Vital signs reviewed and stable  Post vital signs: Reviewed and stable  Last Vitals:  Vitals Value Taken Time  BP 110/56   Temp    Pulse 70   Resp 10   SpO2 100     Last Pain:  Vitals:   08/21/24 1042  TempSrc: Temporal         Complications: There were no known notable events for this encounter.

## 2024-08-21 NOTE — Anesthesia Preprocedure Evaluation (Addendum)
"                                    Anesthesia Evaluation  Patient identified by MRN, date of birth, ID band Patient awake    Reviewed: Allergy & Precautions, H&P , NPO status , Patient's Chart, lab work & pertinent test results, reviewed documented beta blocker date and time   Airway Mallampati: II  TM Distance: >3 FB Neck ROM: Full    Dental no notable dental hx. (+) Edentulous Upper, Edentulous Lower, Dental Advisory Given   Pulmonary neg pulmonary ROS   Pulmonary exam normal breath sounds clear to auscultation       Cardiovascular hypertension, Pt. on medications and Pt. on home beta blockers  Rhythm:Irregular Rate:Tachycardia  S/p TAVR   Neuro/Psych negative neurological ROS  negative psych ROS   GI/Hepatic negative GI ROS, Neg liver ROS,,,  Endo/Other  diabetes, Insulin  DependentHypothyroidism    Renal/GU negative Renal ROS  negative genitourinary   Musculoskeletal   Abdominal   Peds  Hematology negative hematology ROS (+)   Anesthesia Other Findings   Reproductive/Obstetrics negative OB ROS                              Anesthesia Physical Anesthesia Plan  ASA: 3  Anesthesia Plan: General   Post-op Pain Management: Minimal or no pain anticipated   Induction: Intravenous  PONV Risk Score and Plan: 3 and Propofol  infusion and Treatment may vary due to age or medical condition  Airway Management Planned: Natural Airway and Nasal Cannula  Additional Equipment:   Intra-op Plan:   Post-operative Plan:   Informed Consent: I have reviewed the patients History and Physical, chart, labs and discussed the procedure including the risks, benefits and alternatives for the proposed anesthesia with the patient or authorized representative who has indicated his/her understanding and acceptance.     Dental advisory given  Plan Discussed with: CRNA  Anesthesia Plan Comments:          Anesthesia Quick  Evaluation  "

## 2024-08-21 NOTE — Anesthesia Postprocedure Evaluation (Signed)
"   Anesthesia Post Note  Patient: Sarah Shelton  Procedure(s) Performed: CARDIOVERSION     Patient location during evaluation: Cath Lab Anesthesia Type: General Level of consciousness: awake and alert Pain management: pain level controlled Vital Signs Assessment: post-procedure vital signs reviewed and stable Respiratory status: spontaneous breathing, nonlabored ventilation and respiratory function stable Cardiovascular status: blood pressure returned to baseline and stable Postop Assessment: no apparent nausea or vomiting Anesthetic complications: no   There were no known notable events for this encounter.  Last Vitals:  Vitals:   08/21/24 1140 08/21/24 1145  BP: 135/61 (!) 146/67  Pulse: 69 73  Resp: 18 17  Temp:    SpO2: 100% 100%    Last Pain:  Vitals:   08/21/24 1130  TempSrc: Temporal  PainSc: 0-No pain                 Nilesh Stegall,W. EDMOND      "

## 2024-08-21 NOTE — Interval H&P Note (Signed)
 History and Physical Interval Note:  08/21/2024 11:09 AM  [name]  has presented today for surgery, with the diagnosis of AFIB.  The various methods of treatment have been discussed with the patient and family. After consideration of risks, benefits and other options for treatment, the patient has consented to  Procedures: CARDIOVERSION (N/A) as a surgical intervention.  The patient's history has been reviewed, patient examined, no change in status, stable for surgery.  I have reviewed the patient's chart and labs.  Questions were answered to the patient's satisfaction.     Vinie JAYSON Maxcy

## 2024-08-21 NOTE — CV Procedure (Signed)
" ° ° °  CARDIOVERSION NOTE  Procedure: Electrical Cardioversion Indications:  Atrial Fibrillation  Procedure Details:  Consent: Risks of procedure as well as the alternatives and risks of each were explained to the (patient/caregiver).  Consent for procedure obtained.  Time Out: Verified patient identification, verified procedure, site/side was marked, verified correct patient position, special equipment/implants available, medications/allergies/relevent history reviewed, required imaging and test results available.  Performed  Patient placed on cardiac monitor, pulse oximetry, supplemental oxygen as necessary.  Sedation given: per anesthesia Pacer pads placed anterior and posterior chest.  Cardioverted 1 time(s).  Cardioverted at 200J biphasic.  Impression: Findings: Post procedure EKG shows: NSR Complications: None Patient did tolerate procedure well.  Plan: Successful DCCV with a single 200J biphasic shock to NSR.  Time Spent Directly with the Patient:  30 minutes   Vinie KYM Maxcy, MD, [facility], FNLA, FACP  [place]  [facility]  Medical Director of the Advanced Lipid Disorders &  Cardiovascular Risk Reduction Clinic Diplomate of the American Board of Clinical Lipidology Attending Cardiologist  Direct Dial: [phone]  Fax: [phone]  Website:  [email or web address] Vinie BROCKS Patryck Kilgore 08/21/2024, 11:15 AM     "

## 2024-08-23 ENCOUNTER — Encounter (HOSPITAL_COMMUNITY): Payer: Self-pay | Admitting: Internal Medicine

## 2024-08-23 NOTE — Progress Notes (Deleted)
 "  Cardiology Office Note:    Date:  08/23/2024   ID:  Sarah Shelton, DOB 10/10/1945, MRN [id number]  PCP:  Nichole Senior, MD  Cardiologist:  Wilbert Bihari, MD Structural Heart:  Lonni Cash, MD{ Click to update primary MD,subspecialty MD or APP then REFRESH:1}    Referring MD: Nichole Senior, MD   Chief Complaint: follow-up of atrial fibrillation  History of Present Illness:    Sarah Shelton is a 78 y.o. 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 Bihari and presents today for follow-up of atrial fibrillation.  Patient was initially referred to Cardiology in 09/2021 for severe aortic stenosis. She was initially asymptomatic with this and elected to defer surgery. However, she then started to develop CHF symptoms with shortness of breath with minimal exertion and edema in the fall of 2025. She was seen by Dr. Wendel in 06/2024 and was noted to be in new onset atrial fibrillation. Echo showed showed a mild drop in LVEF to 40-45% with global hypokinesis and grade 1 diastolic dysfunction, mildly enlarged RV with normal RV function and moderately elevated PASP, severe AS / mild AI (mean gradient 41 mmHg and  VTI 0.46 cm2), and moderate MR. She underwent R/ LHC later that month showed mild non-obstructive CAD. She underwent successful TAVR. One month post-op Echo in 08/2024 showed LVEF of 40-45% with mildly reduced RV function, normal structure/ function of TAVR, and mild to moderate MR. She was last seen by [name], PA-C, in 08/2024 at which time she was doing well. She was no longer having any dyspnea and her edema had improved. She was still in atrial fibrillation at that time.  Therefore, outpatient DCCV was arranged. She underwent successful DCCV with restoration of sinus rhythm on 08/21/2024.   Patient presents today for follow-up. ***  Persistent Atrial Fibrillation Diagnosed in 06/2024. S/p successful DCCV with restoration of sinus rhythm on 08/21/2024.  - EKG today shows *** - Continue Toprol -XL 25mg  daily.  - Continue chronic anticoagulation with Eliquis  5mg  twice daily.   Mild Non-Obstructive CAD Noted on cardiac catheterization in 06/2024 prior to TAVR.  - No chest pain.  - No Aspirin  given need for Eliquis .  - Statin ***  Chronic HFmrEF  Non-Ischemic Cardiomyopathy Last Echo in 08/2024 showed LVEF of 40-45% with mildly reduced RV function, normal structure/ function of TAVR, and mild to moderate MR. - Continue Lasix  20mg  twice daily.  - Continue Toprol -XL 25mg  daily.  - More GDMT: Losartan +/- Spiro or SGLT2 inhibitor *** - Repeat limited Echo to reassess LV function. ***  Hypertension BP ***  - Will treat in context of CHF as above.  Hyperlipidemia Lipid panel in *** - Not currently on any statins. Will start Lipitor 40mg  daily. *** - Will start lipid panel and LFTs in 2-3 months. ***  Type 2 Diabetes Mellitus Hemoglobin A1c *** - On Insulin . - Management per PCP.  CKD Stage IIIa Baseline creatinine 0.9 to 1.2.   EKGs/Labs/Other Studies Reviewed:    The following studies were reviewed:  Right/ Left Cardiac Catheterization 06/26/2024:   RPDA lesion is 50% stenosed.   Mid Cx lesion is 40% stenosed.   Mid LAD lesion is 20% stenosed.   RPAV lesion is 40% stenosed.  1.  Tight right radial loop requiring coronary wire to cross into complete procedure. 2.  Mild, nonobstructive coronary artery disease. 3.  Fick cardiac output of 4.3 L/min and Fick cardiac index of 2.7 L/min/m with the following hemodynamics:            Right atrial pressure mean of 12 mmHg            Right ventricular pressure 54/-2 with an end-diastolic pressure  of 8 mmHg            Wedge pressure mean of 20 mmHg V waves to 27 mmHg            PA pressure 59/22 with a mean of 37 mmHg            PVR of 3.95 Woods units            PA pulsatility index of 3 4.  Capacious iliofemoral vessels bilaterally.  If transcatheter aortic valve replacement is pursued then the left side should be used as the bifurcation is well below the femoral head.  The bifurcation on the right side is at the mid femoral head level.   Summary: Continue evaluation for aortic valve intervention.  Diagnostic Dominance: Right  _______________  Echocardiogram 08/20/2024: Impressions:  1. Left ventricular ejection fraction, by estimation, is 40 to 45%. The  left ventricle has mildly decreased function. The left ventricle  demonstrates global hypokinesis. There is mild concentric left ventricular  hypertrophy. Left ventricular diastolic  function could not be evaluated. Elevated left atrial pressure.   2. Right ventricular systolic function is mildly reduced. The right  ventricular size is mildly enlarged. There is moderately elevated  pulmonary artery systolic pressure.   3. Left atrial size was moderately dilated.   4. Right atrial size was moderately dilated.   5. The mitral valve is degenerative. Mild to moderate mitral valve  regurgitation. No evidence of mitral stenosis.   6. Tricuspid valve regurgitation is moderate.   7. The aortic valve has been repaired/replaced. Aortic valve  regurgitation is not visualized. No aortic stenosis is present. There is a  23 mm Medtr[employer] valve present in the aortic position. Procedure Date:  07/21/2024. Echo findings are consistent with  normal structure and function of the aortic valve prosthesis. Aortic valve  mean gradient measures 9.0 mmHg. Aortic valve Vmax measures 1.94 m/s.   8. The inferior vena cava is dilated in size with <50% respiratory  variability, suggesting right atrial pressure of 15 mmHg.   EKG:  EKG ordered today.  EKG personally reviewed and demonstrates ***.  Recent Labs: 07/21/2024: ALT 20 07/22/2024: Magnesium  1.8 08/20/2024: BUN 19; Creatinine, Ser 1.01; Hemoglobin 11.6; Platelets 142; Potassium CANCELED; Sodium 140  Recent Lipid Panel    Component Value Date/Time   CHOL 182 10/30/2021 1524   TRIG 121 10/30/2021 1524   HDL 63 10/30/2021 1524   CHOLHDL 2.9 10/30/2021 1524   LDLCALC 98 10/30/2021 1524    Physical Exam:    Vital Signs: There were no vitals taken for this visit.    Wt Readings from Last 3 Encounters:  08/21/24 146 lb (66.2 kg)  07/27/24 153 lb (69.4 kg)  07/22/24 146 lb 9.7 oz (66.5 kg)     General: 78 y.o. female in no acute distress. HEENT: Normocephalic and atraumatic. Sclera clear.  Neck: Supple. No carotid bruits. No JVD. Heart: *** RRR. Distinct S1 and S2. No murmurs, gallops, or rubs.  Lungs: No increased work of breathing. Clear to  ausculation bilaterally. No wheezes, rhonchi, or rales.  Abdomen: Soft, non-distended, and non-tender to palpation.  Extremities: No lower extremity edema.  Radial and distal pedal pulses 2+ and equal bilaterally. Skin: Warm and dry. Neuro: No focal deficits. Psych: Normal affect. Responds appropriately.   Assessment:    No diagnosis found.  Plan:     Disposition: Follow up in ***   Signed, Aline FORBES Door, PA-C  08/23/2024 8:26 PM    [facility] "

## 2024-09-02 ENCOUNTER — Ambulatory Visit: Attending: Student

## 2024-09-02 ENCOUNTER — Encounter: Payer: Self-pay | Admitting: Student

## 2024-09-02 VITALS — BP 148/62 | HR 69 | Resp 16 | Ht 60.0 in | Wt 146.0 lb

## 2024-09-02 DIAGNOSIS — I10 Essential (primary) hypertension: Secondary | ICD-10-CM | POA: Diagnosis not present

## 2024-09-02 DIAGNOSIS — I2510 Atherosclerotic heart disease of native coronary artery without angina pectoris: Secondary | ICD-10-CM | POA: Diagnosis not present

## 2024-09-02 DIAGNOSIS — E782 Mixed hyperlipidemia: Secondary | ICD-10-CM | POA: Insufficient documentation

## 2024-09-02 DIAGNOSIS — I480 Paroxysmal atrial fibrillation: Secondary | ICD-10-CM | POA: Insufficient documentation

## 2024-09-02 DIAGNOSIS — Z952 Presence of prosthetic heart valve: Secondary | ICD-10-CM | POA: Insufficient documentation

## 2024-09-02 MED ORDER — METOPROLOL SUCCINATE ER 25 MG PO TB24: 25.0000 mg | ORAL_TABLET | Freq: Two times a day (BID) | ORAL | 3 refills | Status: AC

## 2024-09-02 NOTE — Progress Notes (Signed)
 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    Sarah Shelton established care in 2023 with Dr. Bihari.  She had been found to have a heart murmur by her PCP previously, echo 10/18/2021 showed LVEF 65-70%, G2DD, mildly elevated PASP, moderate to severe AS.   She was asymptomatic, but an ETT was ordered 10/26/2021 to assess for functional capacity and this was noted to be slightly abnormal. This was felt to possibly be from strain from her severe AS, but right and left heart catheterization and TAVR work-up was recommended. 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 Patient declined TAVR because she was asymptomatic at the time.   However, she started to develop HF symptoms such as dyspnea and edema in the fall of 2025. She was also noted to be in new-onset atrial fibrillation. Echo 06/12/2024 showed LVEF 40-45%, global hypokinesis, G1DD, moderate TR, severe AS.  Cardiac catheterization 06/26/2024 showed mild nonobstructive CAD without indication for intervention, elevated right heart pressures.  She ultimately underwent TAVR 07/21/2024.  Echo on POD 1 showed improvement of LVEF 55-60% with well-functioning TAVR valve.  She was noted to be in atrial fibrillation that was rate controlled on the day of her discharge and Eliquis  was restarted.  She was also continued on Toprol . One-month post-operative echo 08/20/2024 LVEF had decreased again to 40-45%.She was seen in clinic by Sarah Hummer, PA and unfortunately had worsened LE edema after discharge and heart rate was difficult to control. She was still in atrial fibrillation. She was prescribed Lasix  20 mg twice daily and set up for TEE/DCCV.  08/21/2024 she was successfully cardioverted to NSR.    History of Present Illness: Today she states that she feels great! She was asymptomatic with atrial fibrillation so doesn't notice much difference in how she feels since the cardioversion, but does note that after waking up from TAVR, her breathing was instantly improved. She has not checked her BP at home in a while, but notes that she used to get 140s-150s when she did. Her BP was initially very elevated in the office, but 148/62 on my recheck.   ROS: Denies chest pain, shortness of breath, orthopnea, PND, lower extremity edema, palpitations, lightheadedness, dizziness, syncope, abnormal bleeding.   Studies Reviewed: The following studies were personally reviewed today: EKG Interpretation Date/Time:  Wednesday September 02 2024 14:45:39 EST Ventricular Rate:  71 PR Interval:  178 QRS Duration:  90 QT Interval:  426 QTC Calculation: 462 R Axis:   3  Text Interpretation: Sinus rhythm with Premature atrial complexes Confirmed by Nola Numbers ([id number]) on 09/02/2024 3:49:09 PM   Cardiac Studies & Procedures   ______________________________________________________________________________________________ CARDIAC CATHETERIZATION  CARDIAC CATHETERIZATION 06/26/2024  Conclusion   RPDA lesion is 50% stenosed.   Mid Cx lesion is 40% stenosed.   Mid LAD lesion is 20% stenosed.   RPAV lesion is 40% stenosed.  1.  Tight right radial loop requiring coronary wire to cross into complete procedure. 2.  Mild, nonobstructive coronary artery disease. 3.  Fick cardiac output of  4.3 L/min and Fick cardiac index of 2.7 L/min/m with the following hemodynamics: Right atrial pressure mean of 12 mmHg Right ventricular pressure 54/-2 with an  end-diastolic pressure of 8 mmHg Wedge pressure mean of 20 mmHg V waves to 27 mmHg PA pressure 59/22 with a mean of 37 mmHg PVR of 3.95 Woods units PA pulsatility index of 3 4.  Capacious iliofemoral vessels bilaterally.  If transcatheter aortic valve replacement is pursued then the left side should be used as the bifurcation is well below the femoral head.  The bifurcation on the right side is at the mid femoral head level.  Summary: Continue evaluation for aortic valve intervention.  Findings Coronary Findings Diagnostic  Dominance: Right  Left Anterior Descending Mid LAD lesion is 20% stenosed.  Left Circumflex Mid Cx lesion is 40% stenosed.  Right Coronary Artery There is mild diffuse disease throughout the vessel.  Right Posterior Descending Artery RPDA lesion is 50% stenosed.  Right Posterior Atrioventricular Artery RPAV lesion is 40% stenosed.  Intervention  No interventions have been documented.   STRESS TESTS  EXERCISE TOLERANCE TEST (ETT) 10/26/2021  Interpretation Summary   2.0 mm of down sloping ST depression in the inferior and lateral leads was noted. ST depression began during stress. ST deviation persisted during recovery. ECG was interpretable and conclusive. The ECG was positive for ischemia.   Prior study not available for comparison.   ECHOCARDIOGRAM  ECHOCARDIOGRAM COMPLETE 08/20/2024  Narrative ECHOCARDIOGRAM REPORT    Patient Name:   Sarah Shelton Date of Exam: 08/20/2024 Medical Rec #:  [id number]          Height:       60.0 in Accession #:    [id number]         Weight:       153.0 lb Date of Birth:  19-Oct-1945         BSA:          1.666 m Patient Age:    78 years           BP:           142/68 mmHg Patient Gender: F                  HR:           95 bpm. Exam Location:  [address]  Procedure: 2D Echo, Cardiac Doppler and Color Doppler (Both Spectral and Color Flow Doppler were utilized during procedure).  Indications:     Z95.2 Post TAVR  History:        Patient has prior history of Echocardiogram examinations, most recent 07/22/2024. TAVR-38mm Medtronic Ecolut FX +; Risk Factors:Hypertension, Diabetes and Dyslipidemia. Aortic Valve: 23 mm Medtronic valve is present in the aortic position. Procedure Date: 07/21/2024.  Sonographer:    Carl Coma RDCS Referring Phys: [id number] KATHRYN R THOMPSON  IMPRESSIONS   1. Left ventricular ejection fraction, by estimation, is 40 to 45%. The left ventricle has mildly decreased function. The left ventricle demonstrates global hypokinesis. There is mild concentric left ventricular hypertrophy. Left ventricular diastolic function could not be evaluated. Elevated left atrial pressure. 2. Right ventricular systolic function is mildly reduced. The right ventricular size is mildly enlarged. There is moderately elevated pulmonary artery systolic pressure. 3. Left atrial size was moderately dilated. 4. Right atrial size was moderately dilated. 5. The mitral valve is degenerative. Mild to moderate mitral valve regurgitation. No evidence of mitral stenosis. 6. Tricuspid valve regurgitation is moderate. 7. The aortic  valve has been repaired/replaced. Aortic valve regurgitation is not visualized. No aortic stenosis is present. There is a 23 mm Medtronic valve present in the aortic position. Procedure Date: 07/21/2024. Echo findings are consistent with normal structure and function of the aortic valve prosthesis. Aortic valve mean gradient measures 9.0 mmHg. Aortic valve Vmax measures 1.94 m/s. 8. The inferior vena cava is dilated in size with <50% respiratory variability, suggesting right atrial pressure of 15 mmHg.  FINDINGS Left Ventricle: Left ventricular ejection fraction, by estimation, is 40 to 45%. The left ventricle has mildly decreased function. The left ventricle demonstrates global hypokinesis. The left ventricular internal cavity size was normal in size. There  is mild concentric left ventricular hypertrophy. Left ventricular diastolic function could not be evaluated due to atrial fibrillation. Left ventricular diastolic function could not be evaluated. Elevated left atrial pressure.  Right Ventricle: The right ventricular size is mildly enlarged. No increase in right ventricular wall thickness. Right ventricular systolic function is mildly reduced. There is moderately elevated pulmonary artery systolic pressure. The tricuspid regurgitant velocity is 2.94 m/s, and with an assumed right atrial pressure of 15 mmHg, the estimated right ventricular systolic pressure is 49.6 mmHg.  Left Atrium: Left atrial size was moderately dilated.  Right Atrium: Right atrial size was moderately dilated.  Pericardium: There is no evidence of pericardial effusion.  The mitral valve is degenerative in appearance. Mild to moderate mitral valve regurgitation. No evidence of mitral valve stenosis. Tricuspid Valve: The tricuspid valve is normal in structure. Tricuspid valve regurgitation is moderate . No evidence of tricuspid stenosis.  The aortic valve has been repaired/replaced. Aortic valve regurgitation is not visualized. No aortic stenosis is present. There is a 23 mm Medtronic valve present in the aortic position. Procedure Date: 07/21/2024. Echo findings are consistent with normal structure and function of the aortic valve prosthesis. Pulmonic Valve: The pulmonic valve was normal in structure. Pulmonic valve regurgitation is not visualized. No evidence of pulmonic stenosis.  Aorta: The aortic root is normal in size and structure.  Venous: The inferior vena cava is dilated in size with less than 50% respiratory variability, suggesting right atrial pressure of 15 mmHg.  IAS/Shunts: No atrial level shunt detected by color flow Doppler.   LEFT VENTRICLE PLAX 2D LVIDd:         4.20 cm LVIDs:         3.10 cm LV PW:         1.20 cm LV IVS:        1.00 cm   RIGHT  VENTRICLE             IVC RV Basal diam:  4.40 cm     IVC diam: 2.20 cm RV S prime:     10.88 cm/s TAPSE (M-mode): 1.8 cm  LEFT ATRIUM             Index        RIGHT ATRIUM           Index LA diam:        4.70 cm 2.82 cm/m   RA Area:     21.80 cm LA Vol (A2C):   81.5 ml 48.93 ml/m  RA Volume:   71.20 ml  42.74 ml/m LA Vol (A4C):   55.6 ml 33.38 ml/m LA Biplane Vol: 68.2 ml 40.94 ml/m AORTIC VALVE AV Vmax:           194.00 cm/s AV Vmean:  138.300 cm/s AV VTI:            0.384 m AV Peak Grad:      15.1 mmHg AV Mean Grad:      9.0 mmHg LVOT Vmax:         64.90 cm/s LVOT Vmean:        44.467 cm/s LVOT VTI:          0.121 m LVOT/AV VTI ratio: 0.31  AORTA Ao Root diam: 2.60 cm Ao Asc diam:  3.50 cm  MITRAL VALVE                TRICUSPID VALVE MV Peak grad: 12.6 mmHg     TR Peak grad:   34.6 mmHg MV Mean grad: 4.7 mmHg      TR Vmax:        294.00 cm/s MV Vmax:      1.77 m/s MV Vmean:     97.1 cm/s     SHUNTS MV E velocity: 145.00 cm/s  Systemic VTI: 0.12 m  Morene Brownie Electronically signed by Morene Brownie Signature Date/Time: 08/20/2024/3:11:26 PM    Final      CT SCANS  CT CORONARY MORPH W/CTA COR W/SCORE 07/01/2024  Addendum 07/04/2024 12:06 PM ADDENDUM REPORT: 07/04/2024 12:03  EXAM: OVER-READ INTERPRETATION  CT CHEST  The following report is an over-read performed by radiologist Dr. Manford Breaker [facility] on 07/04/2024. This over-read does not include interpretation of cardiac or coronary anatomy or pathology. The coronary CTA interpretation by the cardiologist is attached.  COMPARISON:  CTA chest dated 11/29/2021  FINDINGS: Cardiovascular: Aortic atherosclerosis. Otherwise normal appearance of extracardiac vascular structures.  Mediastinum/Nodes: Normal esophagus. 10 mm right paratracheal (305:8) and subcarinal lymph node (305:13).  Lungs/Pleura: The imaged central airways are patent. No focal consolidation. No  pneumothorax. No pleural effusion.  Upper abdomen: Normal.  Musculoskeletal: No acute or abnormal lytic or blastic osseous lesions. Multilevel degenerative changes of the thoracic spine. Incomplete segmentation of T8-9.  IMPRESSION: 1.  Aortic Atherosclerosis (ICD10-I70.0). 2. Nonspecific 10 mm right paratracheal and subcarinal lymph nodes.   Electronically Signed By: Limin  Xu M.D. On: 07/04/2024 12:03  Narrative CLINICAL DATA:  Aortic valve replacement (TAVR), pre-op eval  EXAM: Cardiac TAVR CT  TECHNIQUE: A non-contrast, gated CT scan was obtained with axial slices of 2.5 mm through the heart for aortic valve scoring. A 120 kV retrospective, gated, contrast cardiac scan was obtained. Gantry rotation speed was 230 msec and collimation was 0.63 mm. Nitroglycerin  was not given. A delayed scan was obtained to exclude left atrial appendage thrombus. The 3D dataset was reconstructed in systole with motion correction. The 3D data set was reconstructed in 5% intervals of the 0-95% of the R-R cycle. Systolic and diastolic phases were analyzed on a dedicated workstation using MPR, MIP, and VRT modes. The patient received 100 cc of contrast.  FINDINGS: Aortic Valve:  Tricuspid aortic valve with severely reduced cusp excursion. Severely thickened and severely calcified aortic valve cusps.  AV calcium score: 1259  Virtual Basal Annulus Measurements:  Maximum/Minimum Diameter: 23.2 x 17.7 mm  Perimeter: 63.9 mm  Area:  311 mm2  No significant LVOT calcifications.  Membranous septal length: 5.6 mm  Based on these measurements, the annulus would be suitable for a 20 mm Sapien 3 valve. Alternatively, Heart Team can consider 23/26 mm Evolut valve, borderline measurements for 26 mm due to sinus dimensions. Recommend Heart Team discussion for valve selection.  Sinus of Valsalva Measurements:  Non-coronary:  26 mm  Right - coronary:  26 mm  Left - coronary:  28  mm  Coronary height and sinus of Valsalva Height:  Left main: 15 mm, Left sinus: 19 mm  Right coronary: 15 mm, Right sinus: 18 mm  Aorta: Normal variant 4 vessel branch pattern of aortic arch, left vertebral artery off the arch.  Sinotubular Junction:  26 mm  Ascending Thoracic Aorta:  34 mm  Aortic Arch:  25 mm  Descending Thoracic Aorta:  20 mm  Coronary Arteries: Normal coronary origin. Right dominance. The study was performed without use of NTG and insufficient for plaque evaluation. Coronary artery calcium seen in 3 vessel distribution.  Optimum Fluoroscopic Angle for Delivery: LAO 1 CAU 0  OTHER:  Atria: Severe biatrial enlargement  Left atrial appendage: likely slow flowing contrast in the tip of the appendage, however cannot exclude small LAA thrombus.  Mitral valve: Grossly normal, mild mitral annular calcifications.  Pulmonary artery: Normal caliber.  Pulmonary veins: Normal anatomy.  IMPRESSION: 1. Tricuspid aortic valve with severely reduced cusp excursion. Severely thickened and severely calcified aortic valve cusps. 2. Aortic valve calcium score: 1259 3. Annulus area: 311 mm2, suitable for 20 mm Sapien 3 valve. No LVOT calcifications. Membranous septal length 5.6 mm. 4. Sufficient coronary artery heights from annulus. 5. Optimum fluoroscopic angle for delivery: LAO 1 CAU 0  Electronically Signed: By: Soyla Merck M.D. On: 07/02/2024 12:56 ______________________________________________________________________________________________                Physical Exam: VS: BP (!) 148/62 (BP Location: Right Arm, Patient Position: Sitting, Cuff Size: Normal)   Pulse 69   Resp 16   Ht 5' (1.524 m)   Wt 146 lb (66.2 kg)   SpO2 100%   BMI 28.51 kg/m  Wt Readings from Last 3 Encounters:  09/02/24 146 lb (66.2 kg)  08/21/24 146 lb (66.2 kg)  07/27/24 153 lb (69.4 kg)   GEN: Well nourished, in NAD HEENT: Normal NECK: No JVD CARDIAC: RRR, no  murmurs, rubs, gallops RESPIRATORY: Clear to auscultation without rales, wheezing or rhonchi  ABDOMEN: Soft, non-tender, non-distended MUSCULOSKELETAL: No edema  SKIN: Warm and dry NEUROLOGIC:  Alert and oriented x 3 PSYCHIATRIC:  Pleasant, normal affect   Assessment & Plan: 1. Paroxysmal atrial fibrillation: Successfully cardioverted to NSR 08/21/2024. EKG today shows NSR with PACs. CHA2DS2-VASc=7. - Continue Eliquis  5 mg twice daily - Continue Toprol -XL 25 mg twice daily  2. Hypertension: BP 176/61, 172/55 on arrival to clinic.  148/62 on my recheck.  Patient does have a blood pressure cuff at home and is willing to check her blood pressure.  I recommended starting losartan 25 mg daily, however patient was reluctant to do this due to financial reasons.  Losartan is available for around $5 in the [facility].  Social work offered, this was declined. - She will check her BP 2 hours after her morning medications for 2 weeks and bring the log to the office - she is agreeable to starting losartan 25 mg daily and getting it at the [facility] pharmacy if her BP log shows continued hypertension  3. Severe AS/history of TAVR/HFmrEF: TAVR completed 07/21/2024.  EF improved from 40-45% in 06/2024 to 55-60% in 07/2024 POD 1 after TAVR. However, one-month follow-up echo showed LVEF had decreased again to 40-45%. Appears euvolemic on exam today. - Continue Lasix  20 mg twice daily - consider adding spironolactone 12.5 mg daily for volume management in the future if needed -  Had initially planned to start losartan today with a plan to add spironolactone to optimize GDMT but patient declined due to cost at this time - Recommend daily weights and limited salt/fluid intake   4. Nonobstructive CAD: LHC 06/26/2024 showed mild nonobstructive CAD, no intervention needed. EKG without ischemic changes. Denies anginal symptoms.  - No aspirin  given that she is on DOAC - Reluctant to start statin due to cost  5.  Hyperlipidemia: 12/11/2023 LDL 106, HDL 61, TGs 69, total 181.  Lipids are followed by her PCP. - I would recommend starting high intensity statin such as atorvastatin 40 mg daily given CAD history, but she is reluctant to add medications due to cost we will defer this for now - Recommend high-fiber, low fat diet and regular physical exercise as tolerated   Dispo: Follow-up in 2-3 months with Dr. Shlomo.  Signed, Saddie GORMAN Cleaves, NP 09/02/2024 4:19 PM [facility] "

## 2024-09-02 NOTE — Patient Instructions (Signed)
 Medication Instructions:  Your physician recommends that you continue on your current medications as directed. Please refer to the Current Medication list given to you today.  *If you need a refill on your cardiac medications before your next appointment, please call your pharmacy*  Lab Work: None ordered If you have labs (blood work) drawn today and your tests are completely normal, you will receive your results only by: MyChart Message (if you have MyChart) OR A paper copy in the mail If you have any lab test that is abnormal or we need to change your treatment, we will call you to review the results.  Testing/Procedures: None ordered  Follow-Up: At [facility], you and your health needs are our priority.  As part of our continuing mission to provide you with exceptional heart care, our providers are all part of one team.  This team includes your primary Cardiologist (physician) and Advanced Practice Providers or APPs (Physician Assistants and Nurse Practitioners) who all work together to provide you with the care you need, when you need it.  Your next appointment:   2-3 month(s)  Provider:   Wilbert Bihari, MD    We recommend signing up for the patient portal called MyChart.  Sign up information is provided on this After Visit Summary.  MyChart is used to connect with patients for Virtual Visits (Telemedicine).  Patients are able to view lab/test results, encounter notes, upcoming appointments, etc.  Non-urgent messages can be sent to your provider as well.   To learn more about what you can do with MyChart, go to [email or web address].

## 2024-11-06 ENCOUNTER — Ambulatory Visit: Admitting: Cardiology

## 2025-07-08 ENCOUNTER — Ambulatory Visit (HOSPITAL_COMMUNITY)

## 2025-07-08 ENCOUNTER — Ambulatory Visit: Admitting: Physician Assistant
